# Patient Record
Sex: Male | Born: 1975 | Race: Black or African American | Hispanic: No | Smoking: Current some day smoker
Health system: Southern US, Community
[De-identification: ages and names within clinical notes are randomized; demographics above are authoritative.]

## PROBLEM LIST (undated history)

## (undated) DIAGNOSIS — J45909 Unspecified asthma, uncomplicated: Secondary | ICD-10-CM

---

## 2015-11-30 ENCOUNTER — Emergency Department (HOSPITAL_COMMUNITY)
Admission: EM | Admit: 2015-11-30 | Discharge: 2015-11-30 | Disposition: A | Discharge status: Home / Self Care | Payer: Self-pay | Attending: Emergency Medicine | Admitting: Emergency Medicine

## 2015-11-30 ENCOUNTER — Encounter (HOSPITAL_COMMUNITY): Payer: Self-pay

## 2015-11-30 DIAGNOSIS — J45901 Unspecified asthma with (acute) exacerbation: Secondary | ICD-10-CM | POA: Insufficient documentation

## 2015-11-30 DIAGNOSIS — Z79899 Other long term (current) drug therapy: Secondary | ICD-10-CM | POA: Insufficient documentation

## 2015-11-30 DIAGNOSIS — F17200 Nicotine dependence, unspecified, uncomplicated: Secondary | ICD-10-CM | POA: Insufficient documentation

## 2015-11-30 HISTORY — DX: Unspecified asthma, uncomplicated: J45.909

## 2015-11-30 MED ORDER — ALBUTEROL SULFATE HFA 108 (90 BASE) MCG/ACT IN AERS: 2.0000 | INHALATION_SPRAY | RESPIRATORY_TRACT | Status: DC | PRN

## 2015-11-30 MED ORDER — ALBUTEROL SULFATE (2.5 MG/3ML) 0.083% IN NEBU: 2.5000 mg | INHALATION_SOLUTION | RESPIRATORY_TRACT | Status: DC | PRN

## 2015-11-30 MED ORDER — ALBUTEROL SULFATE (2.5 MG/3ML) 0.083% IN NEBU
5.0000 mg | INHALATION_SOLUTION | Freq: Once | RESPIRATORY_TRACT | Status: AC
  Administered 2015-11-30: 5 mg via RESPIRATORY_TRACT
  Filled 2015-11-30: qty 6

## 2015-11-30 MED ORDER — ALBUTEROL SULFATE (2.5 MG/3ML) 0.083% IN NEBU
5.0000 mg | INHALATION_SOLUTION | Freq: Once | RESPIRATORY_TRACT | Status: AC
  Administered 2015-11-30: 5 mg via RESPIRATORY_TRACT

## 2015-11-30 MED ORDER — ALBUTEROL SULFATE HFA 108 (90 BASE) MCG/ACT IN AERS
2.0000 | INHALATION_SPRAY | RESPIRATORY_TRACT | Status: DC | PRN
  Administered 2015-11-30: 2 via RESPIRATORY_TRACT
  Filled 2015-11-30: qty 6.7

## 2015-11-30 MED ORDER — AEROCHAMBER PLUS W/MASK MISC
1.0000 | Freq: Once | Status: AC
  Administered 2015-11-30: 1
  Filled 2015-11-30: qty 1

## 2015-11-30 MED ORDER — PREDNISONE 20 MG PO TABS: 40.0000 mg | ORAL_TABLET | Freq: Every day | ORAL | Status: DC

## 2015-11-30 MED ORDER — PREDNISONE 20 MG PO TABS
60.0000 mg | ORAL_TABLET | Freq: Once | ORAL | Status: AC
  Administered 2015-11-30: 60 mg via ORAL
  Filled 2015-11-30: qty 3

## 2015-11-30 MED ORDER — IPRATROPIUM BROMIDE 0.02 % IN SOLN
0.5000 mg | Freq: Once | RESPIRATORY_TRACT | Status: AC
  Administered 2015-11-30: 0.5 mg via RESPIRATORY_TRACT
  Filled 2015-11-30: qty 2.5

## 2015-11-30 MED ORDER — ALBUTEROL SULFATE (2.5 MG/3ML) 0.083% IN NEBU
INHALATION_SOLUTION | RESPIRATORY_TRACT | Status: AC
  Administered 2015-11-30: 5 mg via RESPIRATORY_TRACT
  Filled 2015-11-30: qty 6

## 2015-11-30 NOTE — Discharge Instructions (Signed)
1. Medications: albuterol, prednisone, usual home medications 2. Treatment: rest, drink plenty of fluids, begin OTC antihistamine (Zyrtec or Claritin)  3. Follow Up: Please followup with your primary doctor in 2-3 days for discussion of your diagnoses and further evaluation after today's visit; if you do not have a primary care doctor use the resource guide provided to find one; Please return to the ER for difficulty breathing, high fevers or worsening symptoms.    Asthma, Acute Bronchospasm Acute bronchospasm caused by asthma is also referred to as an asthma attack. Bronchospasm means your air passages become narrowed. The narrowing is caused by inflammation and tightening of the muscles in the air tubes (bronchi) in your lungs. This can make it hard to breathe or cause you to wheeze and cough. CAUSES Possible triggers are:  Animal dander from the skin, hair, or feathers of animals.  Dust mites contained in house dust.  Cockroaches.  Pollen from trees or grass.  Mold.  Cigarette or tobacco smoke.  Air pollutants such as dust, household cleaners, hair sprays, aerosol sprays, paint fumes, strong chemicals, or strong odors.  Cold air or weather changes. Cold air may trigger inflammation. Winds increase molds and pollens in the air.  Strong emotions such as crying or laughing hard.  Stress.  Certain medicines such as aspirin or beta-blockers.  Sulfites in foods and drinks, such as dried fruits and wine.  Infections or inflammatory conditions, such as a flu, cold, or inflammation of the nasal membranes (rhinitis).  Gastroesophageal reflux disease (GERD). GERD is a condition where stomach acid backs up into your esophagus.  Exercise or strenuous activity. SIGNS AND SYMPTOMS   Wheezing.  Excessive coughing, particularly at night.  Chest tightness.  Shortness of breath. DIAGNOSIS  Your health care provider will ask you about your medical history and perform a physical exam.  A chest X-ray or blood testing may be performed to look for other causes of your symptoms or other conditions that may have triggered your asthma attack. TREATMENT  Treatment is aimed at reducing inflammation and opening up the airways in your lungs. Most asthma attacks are treated with inhaled medicines. These include quick relief or rescue medicines (such as bronchodilators) and controller medicines (such as inhaled corticosteroids). These medicines are sometimes given through an inhaler or a nebulizer. Systemic steroid medicine taken by mouth or given through an IV tube also can be used to reduce the inflammation when an attack is moderate or severe. Antibiotic medicines are only used if a bacterial infection is present.  HOME CARE INSTRUCTIONS   Rest.  Drink plenty of liquids. This helps the mucus to remain thin and be easily coughed up. Only use caffeine in moderation and do not use alcohol until you have recovered from your illness.  Do not smoke. Avoid being exposed to secondhand smoke.  You play a critical role in keeping yourself in good health. Avoid exposure to things that cause you to wheeze or to have breathing problems.  Keep your medicines up-to-date and available. Carefully follow your health care provider's treatment plan.  Take your medicine exactly as prescribed.  When pollen or pollution is bad, keep windows closed and use an air conditioner or go to places with air conditioning.  Asthma requires careful medical care. See your health care provider for a follow-up as advised. If you are more than [redacted] weeks pregnant and you were prescribed any new medicines, let your obstetrician know about the visit and how you are doing. Follow up with  your health care provider as directed.  After you have recovered from your asthma attack, make an appointment with your outpatient doctor to talk about ways to reduce the likelihood of future attacks. If you do not have a doctor who manages  your asthma, make an appointment with a primary care doctor to discuss your asthma. SEEK IMMEDIATE MEDICAL CARE IF:   You are getting worse.  You have trouble breathing. If severe, call your local emergency services (911 in the [place]  You develop chest pain or discomfort.  You are vomiting.  You are not able to keep fluids down.  You are coughing up yellow, green, brown, or bloody sputum.  You have a fever and your symptoms suddenly get worse.  You have trouble swallowing. MAKE SURE YOU:   Understand these instructions.  Will watch your condition.  Will get help right away if you are not doing well or get worse.   This information is not intended to replace advice given to you by your health care provider. Make sure you discuss any questions you have with your health care provider.   Document Released: 09/22/2006 Document Revised: 06/12/2013 Document Reviewed: 12/13/2012 Elsevier Interactive Patient Education [employer]2016 [employer] Inc[employer]

## 2015-11-30 NOTE — ED Notes (Signed)
Pt talking on cell phone throughout the duration of his nebulizer treatment.

## 2015-11-30 NOTE — ED Provider Notes (Signed)
CSN: [id number]     Arrival date & time 11/30/15  0258 History   First MD Initiated Contact with Patient 11/30/15 [phone]     Chief Complaint  Patient presents with  . Asthma     (Consider location/radiation/quality/duration/timing/severity/associated sxs/prior Treatment) The history is provided by the patient and medical records. No language interpreter was used.     Jeffrey Briggs is a 40 y.o. male  with a hx of asthma presents to the Emergency Department complaining of gradual, persistent, progressively worsening cough, shortness of breath, wheezing and chest tightness onset 3 days ago.  Patient reports that at that time he was still using his albuterol MDI and nebulizer at home with some relief. He reports that approximately 48 hours ago he ran out of these medications. Since that time he said slowly worsening shortness of breath and wheezing.  Tonight he reports that his wheezing was so bad he was unable to sleep.  Patient denies fever, chills, nausea, vomiting.  No aggravating or alleviating factors.  He reports previous admissions as a child for his asthma but no known intubations.   Past Medical History  Diagnosis Date  . Asthma    History reviewed. No pertinent past surgical history. No family history on file. Social History  Substance Use Topics  . Smoking status: Current Some Day Smoker  . Smokeless tobacco: None  . Alcohol Use: Yes    Review of Systems  Constitutional: Negative for fever, diaphoresis, appetite change, fatigue and unexpected weight change.  HENT: Negative for mouth sores.   Eyes: Negative for visual disturbance.  Respiratory: Positive for cough, chest tightness, shortness of breath and wheezing.   Cardiovascular: Negative for chest pain.  Gastrointestinal: Negative for nausea, vomiting, abdominal pain, diarrhea and constipation.  Endocrine: Negative for polydipsia, polyphagia and polyuria.  Genitourinary: Negative for dysuria, urgency, frequency and  hematuria.  Musculoskeletal: Negative for back pain and neck stiffness.  Skin: Negative for rash.  Allergic/Immunologic: Negative for immunocompromised state.  Neurological: Negative for syncope, light-headedness and headaches.  Hematological: Does not bruise/bleed easily.  Psychiatric/Behavioral: Negative for sleep disturbance. The patient is not nervous/anxious.       Allergies  Review of patient's allergies indicates no known allergies.  Home Medications   Prior to Admission medications   Medication Sig Start Date End Date Taking? Authorizing Provider  albuterol (PROVENTIL HFA;VENTOLIN HFA) 108 (90 Base) MCG/ACT inhaler Inhale 2 puffs into the lungs every 4 (four) hours as needed for wheezing or shortness of breath. 11/30/15   Lameka Disla, PA-C  albuterol (PROVENTIL) (2.5 MG/3ML) 0.083% nebulizer solution Take 3 mLs (2.5 mg total) by nebulization every 4 (four) hours as needed for wheezing or shortness of breath. 11/30/15   Marynell Bies, PA-C  predniSONE (DELTASONE) 20 MG tablet Take 2 tablets (40 mg total) by mouth daily. 11/30/15   Mane Consolo, PA-C   BP 123/68 mmHg  Pulse 68  Temp(Src) 98 F (36.7 C) (Oral)  Resp 20  Ht  (1.702 m)  Wt 104.327 kg  BMI 36.01 kg/m2  SpO2 100% Physical Exam  Constitutional: He appears well-developed and well-nourished. No distress.  Awake, alert, nontoxic appearance  HENT:  Head: Normocephalic and atraumatic.  Mouth/Throat: Oropharynx is clear and moist. No oropharyngeal exudate.  Eyes: Conjunctivae are normal. No scleral icterus.  Neck: Normal range of motion. Neck supple.  Cardiovascular: Normal rate, regular rhythm, normal heart sounds and intact distal pulses.   Pulmonary/Chest: Effort normal. No respiratory distress. He has decreased breath sounds.  He has wheezes ( Throughout).  Equal chest expansion  Abdominal: Soft. Bowel sounds are normal. He exhibits no mass. There is no tenderness. There is no rebound and  no guarding.  Musculoskeletal: Normal range of motion. He exhibits no edema.  Neurological: He is alert.  Speech is clear and goal oriented Moves extremities without ataxia  Skin: Skin is warm and dry. He is not diaphoretic.  Psychiatric: He has a normal mood and affect.  Nursing note and vitals reviewed.   ED Course  Procedures (including critical care time)   MDM   Final diagnoses:  Asthma exacerbation   Jeffrey Briggs presents with a history of asthma. Evidence of asthma exacerbation today. Patient is out of his home meds. Patient given albuterol treatment 2 here in the emergency department along with by mouth prednisone.   6:35 AM Patient ambulated in ED with O2 saturations maintained >90, no current signs of respiratory distress. Lung exam improved after nebulizer treatment. Prednisone given in the ED and pt will bd dc with 5 day burst. Pt states they are breathing at baseline. Pt has been instructed to continue using prescribed medications and to speak with PCP about today's exacerbation.     Dahlia ClientHannah Ashlin Kreps, PA-C 11/30/15 [phone]  Dione Boozeavid Glick, MD 11/30/15 [phone]

## 2015-11-30 NOTE — ED Notes (Signed)
Pt reports asthma exacerbation that started about a week ago and feels SOB. He ran out of his albuterol and does not have his rescue inhaler. Pt speaking in clear complete sentences, RR unlabored.

## 2016-04-16 ENCOUNTER — Encounter (HOSPITAL_COMMUNITY): Payer: Self-pay | Admitting: Emergency Medicine

## 2016-04-16 ENCOUNTER — Emergency Department (HOSPITAL_COMMUNITY)
Admission: EM | Admit: 2016-04-16 | Discharge: 2016-04-16 | Disposition: A | Discharge status: Home / Self Care | Payer: Medicaid - Out of State | Attending: Emergency Medicine | Admitting: Emergency Medicine

## 2016-04-16 DIAGNOSIS — J45901 Unspecified asthma with (acute) exacerbation: Secondary | ICD-10-CM | POA: Insufficient documentation

## 2016-04-16 DIAGNOSIS — Z79899 Other long term (current) drug therapy: Secondary | ICD-10-CM | POA: Insufficient documentation

## 2016-04-16 MED ORDER — ALBUTEROL SULFATE HFA 108 (90 BASE) MCG/ACT IN AERS: 2.0000 | INHALATION_SPRAY | RESPIRATORY_TRACT | 0 refills | Status: DC | PRN

## 2016-04-16 MED ORDER — IPRATROPIUM-ALBUTEROL 0.5-2.5 (3) MG/3ML IN SOLN
3.0000 mL | Freq: Once | RESPIRATORY_TRACT | Status: AC
  Administered 2016-04-16: 3 mL via RESPIRATORY_TRACT
  Filled 2016-04-16: qty 3

## 2016-04-16 MED ORDER — PREDNISONE 20 MG PO TABS: 40.0000 mg | ORAL_TABLET | Freq: Every day | ORAL | 0 refills | Status: DC

## 2016-04-16 MED ORDER — ALBUTEROL SULFATE (2.5 MG/3ML) 0.083% IN NEBU: 2.5000 mg | INHALATION_SOLUTION | Freq: Four times a day (QID) | RESPIRATORY_TRACT | 12 refills | Status: DC | PRN

## 2016-04-16 MED ORDER — ALBUTEROL SULFATE HFA 108 (90 BASE) MCG/ACT IN AERS
2.0000 | INHALATION_SPRAY | Freq: Once | RESPIRATORY_TRACT | Status: AC
  Administered 2016-04-16: 2 via RESPIRATORY_TRACT
  Filled 2016-04-16: qty 6.7

## 2016-04-16 MED ORDER — PREDNISONE 20 MG PO TABS
60.0000 mg | ORAL_TABLET | Freq: Once | ORAL | Status: AC
  Administered 2016-04-16: 60 mg via ORAL
  Filled 2016-04-16: qty 3

## 2016-04-16 NOTE — Discharge Instructions (Signed)
Inhaler or neb treatment every 4 hrs. Prednisone as prescribed until all gone. Follow up with family doctor. Return if worsening.

## 2016-04-16 NOTE — ED Provider Notes (Signed)
[facility]-EMERGENCY DEPT Provider Note   CSN: [id number] Arrival date & time: 04/16/16  0559     History   Chief Complaint Chief Complaint  Patient presents with  . Asthma    HPI Jeffrey Briggs is a 40 y.o. male.  HPI Jeffrey Briggs is a 40 y.o. male with hx of asthma, presents to ED with complaint of worsening wheezing and shortness of breath over the last week. Patient states he has asthma. He has been using nebulizer machine at home with relief. He states he used his last treatment last night. He states he hasn't been able to sleep last night due to wheezing and shortness of breath. He states he started a new job one week ago he works around [employer]. He believes that there something in the mulch that is flaring of his asthma. He denies any cough. He denies any URI symptoms. No fever or chills. No chest pain. States he also ran out of the inhaler about 2 months ago. He hasn't been able to follow-up to get his refills. He also states he is supposed to be taking Claritin but has not been taking it over the last several weeks.  Past Medical History:  Diagnosis Date  . Asthma     There are no active problems to display for this patient.   History reviewed. No pertinent surgical history.     Home Medications    Prior to Admission medications   Medication Sig Start Date End Date Taking? Authorizing Provider  albuterol (PROVENTIL HFA;VENTOLIN HFA) 108 (90 Base) MCG/ACT inhaler Inhale 2 puffs into the lungs every 4 (four) hours as needed for wheezing or shortness of breath. 11/30/15   Hannah Muthersbaugh, PA-C  albuterol (PROVENTIL) (2.5 MG/3ML) 0.083% nebulizer solution Take 3 mLs (2.5 mg total) by nebulization every 4 (four) hours as needed for wheezing or shortness of breath. 11/30/15   Hannah Muthersbaugh, PA-C  predniSONE (DELTASONE) 20 MG tablet Take 2 tablets (40 mg total) by mouth daily. 11/30/15   Dahlia Client Muthersbaugh, PA-C    Family History Family History  Problem  Relation Age of Onset  . Diabetes Other     Social History Social History  Substance Use Topics  . Smoking status: Never Smoker  . Smokeless tobacco: Never Used  . Alcohol use No     Allergies   Review of patient's allergies indicates no known allergies.   Review of Systems Review of Systems  Constitutional: Negative for chills and fever.  Respiratory: Positive for chest tightness, shortness of breath and wheezing. Negative for cough.   Cardiovascular: Negative for chest pain, palpitations and leg swelling.  Gastrointestinal: Negative for abdominal distention, abdominal pain, diarrhea, nausea and vomiting.  Genitourinary: Negative for hematuria.  Musculoskeletal: Negative for arthralgias, myalgias, neck pain and neck stiffness.  Skin: Negative for rash.  Allergic/Immunologic: Negative for immunocompromised state.  Neurological: Negative for dizziness, weakness, light-headedness, numbness and headaches.     Physical Exam Updated Vital Signs BP 144/98 (BP Location: Left Arm)   Pulse 71   Temp 97.8 F (36.6 C) (Oral)   Resp 22   Ht 5\' 7"  (1.702 m)   Wt 109.8 kg   SpO2 97%   BMI 37.92 kg/m   Physical Exam  Constitutional: He appears well-developed and well-nourished. No distress.  HENT:  Head: Normocephalic and atraumatic.  Eyes: Conjunctivae are normal.  Neck: Neck supple.  Cardiovascular: Normal rate, regular rhythm and normal heart sounds.   Pulmonary/Chest: Effort normal. No respiratory distress. He has  wheezes. He has no rales.  Decreased air movement bilaterally, expiratory wheezes present in both lung fields. No respiratory distress, no accessory muscle use. Speaking full sentences  Abdominal: Soft. Bowel sounds are normal. He exhibits no distension. There is no tenderness. There is no rebound.  Musculoskeletal: He exhibits no edema.  Neurological: He is alert.  Skin: Skin is warm and dry.  Nursing note and vitals reviewed.    ED Treatments / Results    Labs (all labs ordered are listed, but only abnormal results are displayed) Labs Reviewed - No data to display  EKG  EKG Interpretation None       Radiology No results found.  Procedures Procedures (including critical care time)  Medications Ordered in ED Medications  predniSONE (DELTASONE) tablet 60 mg (not administered)  ipratropium-albuterol (DUONEB) 0.5-2.5 (3) MG/3ML nebulizer solution 3 mL (not administered)     Initial Impression / Assessment and Plan / ED Course  I have reviewed the triage vital signs and the nursing notes.  Pertinent labs & imaging results that were available during my care of the patient were reviewed by me and considered in my medical decision making (see chart for details).  Clinical Course  Patient with asthma exacerbation, ran out of his medications. On exam, decreased air movement bilaterally with expiratory wheezes. Nebulized treatment ordered and 60 of prednisone. Will reassess. Vital signs are normal.   She received a breathing treatment. Vital signs are normal. We'll discharge home with an inhaler, nebulized treatments, short course of prednisone. Follow with primary care doctor.  Vitals:   04/16/16 0607 04/16/16 0700 04/16/16 0728 04/16/16 0738  BP:  (!) 150/105  132/95  Pulse:  74  63  Resp:  23  18  Temp:      TempSrc:      SpO2:  94% 98% 100%  Weight: 109.8 kg     Height: 5\' 7"  (1.702 m)         Final Clinical Impressions(s) / ED Diagnoses   Final diagnoses:  Exacerbation of asthma, unspecified asthma severity, unspecified whether persistent    New Prescriptions Discharge Medication List as of 04/16/2016  7:38 AM    START taking these medications   Details  !! albuterol (PROVENTIL HFA;VENTOLIN HFA) 108 (90 Base) MCG/ACT inhaler Inhale 2 puffs into the lungs every 4 (four) hours as needed for wheezing or shortness of breath., Starting Fri 04/16/2016, Print    !! albuterol (PROVENTIL) (2.5 MG/3ML) 0.083% nebulizer  solution Take 3 mLs (2.5 mg total) by nebulization every 6 (six) hours as needed for wheezing or shortness of breath., Starting Fri 04/16/2016, Print    !! predniSONE (DELTASONE) 20 MG tablet Take 2 tablets (40 mg total) by mouth daily., Starting Fri 04/16/2016, Print     !! - Potential duplicate medications found. Please discuss with provider.       Jaynie Crumbleatyana Jadene Stemmer, PA-C 04/16/16 1622    Maia PlanJoshua G Long, MD 04/16/16 [phone]

## 2016-04-16 NOTE — ED Triage Notes (Signed)
Pt states he has asthma and has been short of breath for the past 2 days  Pt states he ran out of his albuterol neb medication and does not have an inhaler

## 2016-05-25 ENCOUNTER — Emergency Department (HOSPITAL_COMMUNITY): Payer: No Typology Code available for payment source

## 2016-05-25 ENCOUNTER — Encounter (HOSPITAL_COMMUNITY): Payer: Self-pay | Admitting: Emergency Medicine

## 2016-05-25 ENCOUNTER — Emergency Department (HOSPITAL_COMMUNITY)
Admission: EM | Admit: 2016-05-25 | Discharge: 2016-05-25 | Disposition: A | Discharge status: Home / Self Care | Payer: No Typology Code available for payment source | Attending: Emergency Medicine | Admitting: Emergency Medicine

## 2016-05-25 DIAGNOSIS — Y92410 Unspecified street and highway as the place of occurrence of the external cause: Secondary | ICD-10-CM | POA: Insufficient documentation

## 2016-05-25 DIAGNOSIS — J45909 Unspecified asthma, uncomplicated: Secondary | ICD-10-CM | POA: Diagnosis not present

## 2016-05-25 DIAGNOSIS — S161XXA Strain of muscle, fascia and tendon at neck level, initial encounter: Secondary | ICD-10-CM | POA: Diagnosis not present

## 2016-05-25 DIAGNOSIS — S0990XA Unspecified injury of head, initial encounter: Secondary | ICD-10-CM | POA: Insufficient documentation

## 2016-05-25 DIAGNOSIS — Y939 Activity, unspecified: Secondary | ICD-10-CM | POA: Diagnosis not present

## 2016-05-25 DIAGNOSIS — Y999 Unspecified external cause status: Secondary | ICD-10-CM | POA: Insufficient documentation

## 2016-05-25 DIAGNOSIS — S199XXA Unspecified injury of neck, initial encounter: Secondary | ICD-10-CM | POA: Diagnosis present

## 2016-05-25 DIAGNOSIS — S0003XA Contusion of scalp, initial encounter: Secondary | ICD-10-CM | POA: Diagnosis not present

## 2016-05-25 MED ORDER — ALBUTEROL SULFATE HFA 108 (90 BASE) MCG/ACT IN AERS
2.0000 | INHALATION_SPRAY | RESPIRATORY_TRACT | Status: DC | PRN
  Administered 2016-05-25: 2 via RESPIRATORY_TRACT
  Filled 2016-05-25: qty 6.7

## 2016-05-25 MED ORDER — IBUPROFEN 800 MG PO TABS
800.0000 mg | ORAL_TABLET | Freq: Once | ORAL | Status: AC
  Administered 2016-05-25: 800 mg via ORAL
  Filled 2016-05-25: qty 1

## 2016-05-25 MED ORDER — CYCLOBENZAPRINE HCL 10 MG PO TABS: 10.0000 mg | ORAL_TABLET | Freq: Two times a day (BID) | ORAL | 0 refills | Status: DC | PRN

## 2016-05-25 MED ORDER — IBUPROFEN 800 MG PO TABS: 800.0000 mg | ORAL_TABLET | Freq: Three times a day (TID) | ORAL | 0 refills | Status: DC
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
## 2016-05-25 NOTE — ED Notes (Signed)
Patient transported to X-ray & CT °

## 2016-05-25 NOTE — ED Notes (Signed)
Pt now requesting inhaler stating "I'm out.  I don't have one and my asthma is acting up."
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
## 2016-05-31 ENCOUNTER — Encounter (HOSPITAL_COMMUNITY): Payer: Self-pay | Admitting: Emergency Medicine

## 2016-05-31 ENCOUNTER — Ambulatory Visit (HOSPITAL_COMMUNITY)
Admission: EM | Admit: 2016-05-31 | Discharge: 2016-05-31 | Disposition: A | Discharge status: Home / Self Care | Payer: Medicaid - Out of State | Attending: Family Medicine | Admitting: Family Medicine

## 2016-05-31 DIAGNOSIS — M25511 Pain in right shoulder: Secondary | ICD-10-CM

## 2016-05-31 DIAGNOSIS — M5440 Lumbago with sciatica, unspecified side: Secondary | ICD-10-CM

## 2016-05-31 DIAGNOSIS — M25521 Pain in right elbow: Secondary | ICD-10-CM

## 2016-05-31 DIAGNOSIS — M25512 Pain in left shoulder: Secondary | ICD-10-CM

## 2016-05-31 MED ORDER — PREDNISONE 10 MG (21) PO TBPK: 10.0000 mg | ORAL_TABLET | Freq: Every day | ORAL | 0 refills | Status: DC

## 2016-05-31 MED ORDER — CYCLOBENZAPRINE HCL 10 MG PO TABS: 10.0000 mg | ORAL_TABLET | Freq: Two times a day (BID) | ORAL | 0 refills | Status: DC | PRN

## 2016-05-31 MED ORDER — KETOROLAC TROMETHAMINE 60 MG/2ML IM SOLN
INTRAMUSCULAR | Status: AC
  Filled 2016-05-31: qty 2

## 2016-05-31 MED ORDER — KETOROLAC TROMETHAMINE 60 MG/2ML IM SOLN
60.0000 mg | Freq: Once | INTRAMUSCULAR | Status: AC
  Administered 2016-05-31: 60 mg via INTRAMUSCULAR
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
## 2016-06-02 ENCOUNTER — Ambulatory Visit: Payer: No Typology Code available for payment source | Attending: Internal Medicine | Admitting: Physician Assistant

## 2016-06-02 DIAGNOSIS — M25511 Pain in right shoulder: Secondary | ICD-10-CM | POA: Insufficient documentation

## 2016-06-02 DIAGNOSIS — M25512 Pain in left shoulder: Secondary | ICD-10-CM | POA: Insufficient documentation

## 2016-06-02 DIAGNOSIS — M542 Cervicalgia: Secondary | ICD-10-CM | POA: Insufficient documentation

## 2016-06-02 DIAGNOSIS — J4520 Mild intermittent asthma, uncomplicated: Secondary | ICD-10-CM

## 2016-06-02 DIAGNOSIS — M549 Dorsalgia, unspecified: Secondary | ICD-10-CM | POA: Insufficient documentation

## 2016-06-02 DIAGNOSIS — R51 Headache: Secondary | ICD-10-CM | POA: Insufficient documentation

## 2016-06-02 DIAGNOSIS — J45909 Unspecified asthma, uncomplicated: Secondary | ICD-10-CM | POA: Diagnosis not present

## 2016-06-02 DIAGNOSIS — Z79899 Other long term (current) drug therapy: Secondary | ICD-10-CM | POA: Diagnosis not present

## 2016-06-02 DIAGNOSIS — M25521 Pain in right elbow: Secondary | ICD-10-CM | POA: Diagnosis not present

## 2016-06-02 MED ORDER — ALBUTEROL SULFATE (2.5 MG/3ML) 0.083% IN NEBU: 2.5000 mg | INHALATION_SOLUTION | RESPIRATORY_TRACT | 1 refills | Status: DC | PRN

## 2016-06-02 MED ORDER — PREDNISONE 10 MG (21) PO TBPK: 10.0000 mg | ORAL_TABLET | Freq: Every day | ORAL | 0 refills | Status: DC

## 2016-06-02 MED ORDER — ALBUTEROL SULFATE HFA 108 (90 BASE) MCG/ACT IN AERS: 2.0000 | INHALATION_SPRAY | RESPIRATORY_TRACT | 3 refills | Status: DC | PRN

## 2016-06-02 MED ORDER — CYCLOBENZAPRINE HCL 10 MG PO TABS: 10.0000 mg | ORAL_TABLET | Freq: Two times a day (BID) | ORAL | 0 refills | Status: DC | PRN

## 2016-06-02 MED FILL — ALBUTEROL 0.083% INHAL SOLN: (2.5 MG/3ML | 30 days supply | Qty: 90 | Fill #0

## 2016-06-02 MED FILL — !VENTOLIN HFA INHALER: 108 (90 BAS | 28 days supply | Qty: 18 | Fill #0

## 2016-06-02 MED FILL — CYCLOBENZAPRINE 10 MG TAB: 10 | 10 days supply | Qty: 20 | Fill #0

## 2016-06-02 MED FILL — predniSONE 10 MG TABS: 10 | 14 days supply | Qty: 42 | Fill #0
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
## 2016-06-02 NOTE — Progress Notes (Signed)
Pt is having pain in right arm, lower back, shoulders and 2 fingers on his right hand.

## 2016-06-07 ENCOUNTER — Encounter (HOSPITAL_COMMUNITY): Payer: Self-pay

## 2016-06-07 ENCOUNTER — Emergency Department (HOSPITAL_COMMUNITY): Payer: No Typology Code available for payment source

## 2016-06-07 ENCOUNTER — Ambulatory Visit (HOSPITAL_COMMUNITY)
Admission: EM | Admit: 2016-06-07 | Discharge: 2016-06-07 | Disposition: A | Discharge status: Nursing Home (Includes ICF) | Payer: Self-pay | Attending: Family Medicine | Admitting: Family Medicine

## 2016-06-07 ENCOUNTER — Emergency Department (HOSPITAL_COMMUNITY)
Admission: EM | Admit: 2016-06-07 | Discharge: 2016-06-07 | Disposition: A | Discharge status: Home / Self Care | Payer: No Typology Code available for payment source | Attending: Emergency Medicine | Admitting: Emergency Medicine

## 2016-06-07 DIAGNOSIS — S3992XD Unspecified injury of lower back, subsequent encounter: Secondary | ICD-10-CM | POA: Diagnosis not present

## 2016-06-07 DIAGNOSIS — J45909 Unspecified asthma, uncomplicated: Secondary | ICD-10-CM | POA: Diagnosis not present

## 2016-06-07 DIAGNOSIS — R202 Paresthesia of skin: Secondary | ICD-10-CM

## 2016-06-07 DIAGNOSIS — M549 Dorsalgia, unspecified: Secondary | ICD-10-CM

## 2016-06-07 DIAGNOSIS — Z79899 Other long term (current) drug therapy: Secondary | ICD-10-CM | POA: Diagnosis not present

## 2016-06-07 DIAGNOSIS — R350 Frequency of micturition: Secondary | ICD-10-CM

## 2016-06-07 DIAGNOSIS — R200 Anesthesia of skin: Secondary | ICD-10-CM

## 2016-06-07 LAB — URINALYSIS, ROUTINE W REFLEX MICROSCOPIC
Bilirubin Urine: NEGATIVE
GLUCOSE, UA: NEGATIVE mg/dL
Hgb urine dipstick: NEGATIVE
Ketones, ur: NEGATIVE mg/dL
LEUKOCYTES UA: NEGATIVE
Nitrite: NEGATIVE
PROTEIN: NEGATIVE mg/dL
Specific Gravity, Urine: 1.024 (ref 1.005–1.030)
pH: 6 (ref 5.0–8.0)

## 2016-06-07 LAB — CBG MONITORING, ED: GLUCOSE-CAPILLARY: 91 mg/dL (ref 65–99)

## 2016-06-07 NOTE — ED Triage Notes (Signed)
[facility] complaints of numbness in left foot and upper limbs x2 weeks accompanied with headaches x3 days.  [facility]. He received Xrays and a CT scan.
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
## 2016-06-07 NOTE — ED Notes (Signed)
Pt reports "feeling fine" and wishes to go home.

## 2016-06-07 NOTE — Discharge Instructions (Signed)

## 2016-06-07 NOTE — ED Notes (Signed)
Pt at nurses station requesting to leave. EDP aware. Will work on paperwork
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
## 2016-06-07 NOTE — ED Notes (Signed)
Pt given sandwich meal and water.

## 2016-06-07 NOTE — ED Notes (Signed)
Patient transported to MRI 

## 2016-06-07 NOTE — ED Notes (Signed)
Unable to obtained pt signature for discharge d/t malfunction of signature pad in room.   Reviewed d/c instructions with pt, who had no questions or further complaints at this time. Pt departed in NAD, refused use of wheelchair.
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
## 2016-06-22 ENCOUNTER — Ambulatory Visit: Payer: Self-pay | Admitting: Family Medicine

## 2016-08-31 ENCOUNTER — Emergency Department (HOSPITAL_COMMUNITY)
Admission: EM | Admit: 2016-08-31 | Discharge: 2016-08-31 | Disposition: A | Discharge status: Home / Self Care | Payer: Self-pay | Attending: Emergency Medicine | Admitting: Emergency Medicine

## 2016-08-31 ENCOUNTER — Encounter (HOSPITAL_COMMUNITY): Payer: Self-pay | Admitting: Emergency Medicine

## 2016-08-31 DIAGNOSIS — R69 Illness, unspecified: Secondary | ICD-10-CM

## 2016-08-31 DIAGNOSIS — Z79899 Other long term (current) drug therapy: Secondary | ICD-10-CM | POA: Insufficient documentation

## 2016-08-31 DIAGNOSIS — J45909 Unspecified asthma, uncomplicated: Secondary | ICD-10-CM | POA: Insufficient documentation

## 2016-08-31 DIAGNOSIS — J111 Influenza due to unidentified influenza virus with other respiratory manifestations: Secondary | ICD-10-CM | POA: Insufficient documentation

## 2016-08-31 DIAGNOSIS — J029 Acute pharyngitis, unspecified: Secondary | ICD-10-CM

## 2016-08-31 LAB — RAPID STREP SCREEN (MED CTR MEBANE ONLY): Streptococcus, Group A Screen (Direct): NEGATIVE

## 2016-08-31 MED ORDER — FLUTICASONE PROPIONATE 50 MCG/ACT NA SUSP: 2.0000 | Freq: Every day | NASAL | 0 refills | Status: DC

## 2016-08-31 MED ORDER — OSELTAMIVIR PHOSPHATE 75 MG PO CAPS: 75.0000 mg | ORAL_CAPSULE | Freq: Two times a day (BID) | ORAL | 0 refills | Status: DC

## 2016-08-31 MED ORDER — NAPROXEN 500 MG PO TABS: 500.0000 mg | ORAL_TABLET | Freq: Two times a day (BID) | ORAL | 0 refills | Status: DC

## 2016-08-31 MED ORDER — ACETAMINOPHEN 325 MG PO TABS
650.0000 mg | ORAL_TABLET | Freq: Once | ORAL | Status: AC | PRN
  Administered 2016-08-31: 650 mg via ORAL
  Filled 2016-08-31: qty 2

## 2016-08-31 MED ORDER — CETIRIZINE HCL 10 MG PO TABS: 10.0000 mg | ORAL_TABLET | Freq: Every day | ORAL | 1 refills | Status: DC

## 2016-08-31 NOTE — ED Provider Notes (Signed)
[facility]-EMERGENCY DEPT Provider Note   CSN: [id number] Arrival date & time: 08/31/16  1951     History   Chief Complaint Chief Complaint  Patient presents with  . Generalized Body Aches    HPI Jeffrey Briggs is a 41 y.o. male.  Jeffrey Briggs is a 41 y.o. Male who presents to the emergency department complaining of sore throat, fevers and body aches since yesterday. He reports associated fatigue, postnasal drip and nasal congestion. Reports trying Theraflu at home earlier this morning with little relief. He is not sure if he received his flu shot this year. He reports pain with swallowing but no difficulty swallowing. He denies coughing, trouble swallowing, shortness of breath, nausea, vomiting, diarrhea, rashes, neck stiffness, or urinary symptoms.   The history is provided by the patient and medical records. No language interpreter was used.    Past Medical History:  Diagnosis Date  . Asthma     There are no active problems to display for this patient.   History reviewed. No pertinent surgical history.     Home Medications    Prior to Admission medications   Medication Sig Start Date End Date Taking? Authorizing Provider  albuterol (PROVENTIL HFA;VENTOLIN HFA) 108 (90 Base) MCG/ACT inhaler Inhale 2 puffs into the lungs every 4 (four) hours as needed for wheezing or shortness of breath. Patient not taking: Reported on 06/07/2016 04/16/16   Tatyana Kirichenko, PA-C  albuterol (PROVENTIL HFA;VENTOLIN HFA) 108 (90 Base) MCG/ACT inhaler Inhale 2 puffs into the lungs every 4 (four) hours as needed for wheezing or shortness of breath. 06/02/16   Vivianne Master, PA-C  albuterol (PROVENTIL) (2.5 MG/3ML) 0.083% nebulizer solution Take 3 mLs (2.5 mg total) by nebulization every 6 (six) hours as needed for wheezing or shortness of breath. Patient not taking: Reported on 06/07/2016 04/16/16   Tatyana Kirichenko, PA-C  albuterol (PROVENTIL) (2.5 MG/3ML) 0.083% nebulizer solution  Take 3 mLs (2.5 mg total) by nebulization every 4 (four) hours as needed for wheezing or shortness of breath. 06/02/16   Tiffany Netta Cedars, PA-C  cetirizine (ZYRTEC ALLERGY) 10 MG tablet Take 1 tablet (10 mg total) by mouth daily. 08/31/16   Everlene Farrier, PA-C  cyclobenzaprine (FLEXERIL) 10 MG tablet Take 1 tablet (10 mg total) by mouth 2 (two) times daily as needed for muscle spasms. 06/02/16   Tiffany Netta Cedars, PA-C  fluticasone (FLONASE) 50 MCG/ACT nasal spray Place 2 sprays into both nostrils daily. 08/31/16   Everlene Farrier, PA-C  ibuprofen (ADVIL,MOTRIN) 800 MG tablet Take 1 tablet (800 mg total) by mouth 3 (three) times daily. 05/25/16   Fayrene Helper, PA-C  naproxen (NAPROSYN) 500 MG tablet Take 1 tablet (500 mg total) by mouth 2 (two) times daily with a meal. 08/31/16   Everlene Farrier, PA-C  oseltamivir (TAMIFLU) 75 MG capsule Take 1 capsule (75 mg total) by mouth every 12 (twelve) hours. 08/31/16   Everlene Farrier, PA-C  predniSONE (DELTASONE) 20 MG tablet Take 2 tablets (40 mg total) by mouth daily. Patient not taking: Reported on 06/07/2016 11/30/15   Dahlia Client Muthersbaugh, PA-C  predniSONE (DELTASONE) 20 MG tablet Take 2 tablets (40 mg total) by mouth daily. Patient not taking: Reported on 06/07/2016 04/16/16   Tatyana Kirichenko, PA-C  predniSONE (STERAPRED UNI-PAK 21 TAB) 10 MG (21) TBPK tablet Take 1 tablet (10 mg total) by mouth daily. Take 6 tabs by mouth daily  for 2 days, then 5 tabs for 2 days, then 4 tabs for 2 days, then  3 tabs for 2 days, 2 tabs for 2 days, then 1 tab by mouth daily for 2 days Patient not taking: Reported on 06/07/2016 06/02/16   Vivianne Master, PA-C    Family History Family History  Problem Relation Age of Onset  . Diabetes Other     Social History Social History  Substance Use Topics  . Smoking status: Never Smoker  . Smokeless tobacco: Never Used  . Alcohol use No     Allergies   Patient has no known allergies.   Review of Systems Review of Systems    Constitutional: Negative for chills and fever.  HENT: Positive for congestion, postnasal drip, rhinorrhea, sneezing and sore throat. Negative for mouth sores, trouble swallowing and voice change.   Respiratory: Negative for cough and shortness of breath.   Cardiovascular: Negative for chest pain.  Gastrointestinal: Negative for diarrhea, nausea and vomiting.  Genitourinary: Negative for dysuria.  Musculoskeletal: Negative for back pain and neck pain.  Skin: Negative for rash.  Neurological: Negative for dizziness, light-headedness and headaches.     Physical Exam Updated Vital Signs BP 124/78 (BP Location: Left Arm)   Pulse 98   Temp 100.7 F (38.2 C) (Oral)   Resp 20   SpO2 98%   Physical Exam  Constitutional: He appears well-developed and well-nourished. No distress.  Nontoxic appearing.  HENT:  Head: Normocephalic and atraumatic.  Right Ear: External ear normal.  Left Ear: External ear normal.  Moderate bilateral tonsillar hypertrophy with exudates. Uvula is midline without edema. No peritonsillar abscess. No trismus. No drooling. Tongue protrusion is normal. Rhinorrhea and boggy nasal turbinates bilaterally.  Eyes: Conjunctivae are normal. Pupils are equal, round, and reactive to light. Right eye exhibits no discharge. Left eye exhibits no discharge.  Neck: Normal range of motion. Neck supple. No JVD present. No tracheal deviation present.  Cardiovascular: Normal rate, regular rhythm, normal heart sounds and intact distal pulses.   Pulmonary/Chest: Effort normal and breath sounds normal. No stridor. No respiratory distress. He has no wheezes. He has no rales.  Lungs are clear to auscultation bilaterally. No increased work of breathing. No rales or rhonchi.  Abdominal: Soft. There is no tenderness.  Lymphadenopathy:    He has no cervical adenopathy.  Neurological: He is alert. Coordination normal.  Skin: Skin is warm and dry. Capillary refill takes less than 2 seconds. No  rash noted. He is not diaphoretic. No erythema. No pallor.  Psychiatric: He has a normal mood and affect. His behavior is normal.  Nursing note and vitals reviewed.    ED Treatments / Results  Labs (all labs ordered are listed, but only abnormal results are displayed) Labs Reviewed  RAPID STREP SCREEN (NOT AT [facility])  CULTURE, GROUP A STREP [facility])    EKG  EKG Interpretation None       Radiology No results found.  Procedures Procedures (including critical care time)  Medications Ordered in ED Medications  acetaminophen (TYLENOL) tablet 650 mg (650 mg Oral Given 08/31/16 2034)     Initial Impression / Assessment and Plan / ED Course  I have reviewed the triage vital signs and the nursing notes.  Pertinent labs & imaging results that were available during my care of the patient were reviewed by me and considered in my medical decision making (see chart for details).    This is a 41 y.o. Male who presents to the emergency department complaining of sore throat, fevers and body aches since yesterday. He reports associated fatigue, postnasal  drip and nasal congestion. On arrival to the emergency department the patient has a temperature 100.7. On exam patient is nontoxic appearing. His rhinorrhea and boggy nasal terminates bilaterally. He has mild bilateral tonsillar hypertrophy with exudates. No peritonsillar abscess. No drooling. Lungs are clear auscultation bilaterally. He denies any coughing or shortness of breath. I see no need for chest x-ray at this time, due to his lungs being clear and patient has not complained of any coughing or shortness of breath. Rapid strep was negative. Patient Casimiro NeedleMichael with influenza-like illness. Will start on Tamiflu, Zyrtec, Flonase and naproxen. I discussed the expected course and treatment of flu. I discussed the expected side effects of Tamiflu. I advised the patient to follow-up with their primary care provider this week. I advised the patient to  return to the emergency department with new or worsening symptoms or new concerns. The patient verbalized understanding and agreement with plan.     Final Clinical Impressions(s) / ED Diagnoses   Final diagnoses:  Influenza-like illness  Sore throat    New Prescriptions New Prescriptions   CETIRIZINE (ZYRTEC ALLERGY) 10 MG TABLET    Take 1 tablet (10 mg total) by mouth daily.   FLUTICASONE (FLONASE) 50 MCG/ACT NASAL SPRAY    Place 2 sprays into both nostrils daily.   NAPROXEN (NAPROSYN) 500 MG TABLET    Take 1 tablet (500 mg total) by mouth 2 (two) times daily with a meal.   OSELTAMIVIR (TAMIFLU) 75 MG CAPSULE    Take 1 capsule (75 mg total) by mouth every 12 (twelve) hours.     Everlene FarrierWilliam Joda Braatz, PA-C 08/31/16 [id number]    Shaune Pollackameron Isaacs, MD 09/01/16 [phone]

## 2016-08-31 NOTE — ED Triage Notes (Signed)
Pt states that he has had generalized body aches, sore throat and fatigue since yesterday. Tried home remedies w/o success. Alert and oriented.

## 2016-09-01 MED FILL — ?CETIRIZINE HCL 10 MG TABLE: 10 | 30 days supply | Qty: 30 | Fill #0

## 2016-09-01 MED FILL — ?NAPROXEN 500 MG TAB: 500 MG | 15 days supply | Qty: 30 | Fill #0

## 2016-09-02 LAB — CULTURE, GROUP A STREP (THRC)

## 2017-04-19 ENCOUNTER — Ambulatory Visit (INDEPENDENT_AMBULATORY_CARE_PROVIDER_SITE_OTHER): Payer: Self-pay | Admitting: Orthopaedic Surgery

## 2017-05-24 ENCOUNTER — Ambulatory Visit (INDEPENDENT_AMBULATORY_CARE_PROVIDER_SITE_OTHER): Payer: Self-pay | Admitting: Orthopaedic Surgery

## 2017-05-25 ENCOUNTER — Ambulatory Visit (INDEPENDENT_AMBULATORY_CARE_PROVIDER_SITE_OTHER): Payer: Self-pay | Admitting: Orthopaedic Surgery

## 2017-05-27 ENCOUNTER — Ambulatory Visit (INDEPENDENT_AMBULATORY_CARE_PROVIDER_SITE_OTHER): Payer: PRIVATE HEALTH INSURANCE | Admitting: Orthopaedic Surgery

## 2017-06-02 ENCOUNTER — Ambulatory Visit (INDEPENDENT_AMBULATORY_CARE_PROVIDER_SITE_OTHER): Payer: PRIVATE HEALTH INSURANCE | Admitting: Orthopaedic Surgery

## 2017-06-24 ENCOUNTER — Telehealth (INDEPENDENT_AMBULATORY_CARE_PROVIDER_SITE_OTHER): Payer: Self-pay | Admitting: Orthopaedic Surgery

## 2017-06-24 NOTE — Telephone Encounter (Signed)
IC Cynthia @ [facility] and advised patient was no show for 12/13 appt as she had called requesting notes from the visit

## 2017-06-29 ENCOUNTER — Encounter (HOSPITAL_COMMUNITY): Payer: Self-pay

## 2017-06-29 ENCOUNTER — Emergency Department (HOSPITAL_COMMUNITY): Payer: Worker's Compensation

## 2017-06-29 ENCOUNTER — Emergency Department (HOSPITAL_COMMUNITY)
Admission: EM | Admit: 2017-06-29 | Discharge: 2017-06-29 | Disposition: A | Discharge status: Home / Self Care | Payer: Worker's Compensation | Attending: Emergency Medicine | Admitting: Emergency Medicine

## 2017-06-29 ENCOUNTER — Other Ambulatory Visit: Payer: Self-pay

## 2017-06-29 DIAGNOSIS — M545 Low back pain: Secondary | ICD-10-CM | POA: Diagnosis not present

## 2017-06-29 DIAGNOSIS — R0781 Pleurodynia: Secondary | ICD-10-CM | POA: Diagnosis not present

## 2017-06-29 DIAGNOSIS — J45909 Unspecified asthma, uncomplicated: Secondary | ICD-10-CM | POA: Diagnosis not present

## 2017-06-29 DIAGNOSIS — R200 Anesthesia of skin: Secondary | ICD-10-CM | POA: Insufficient documentation

## 2017-06-29 DIAGNOSIS — M79672 Pain in left foot: Secondary | ICD-10-CM | POA: Diagnosis not present

## 2017-06-29 DIAGNOSIS — Z041 Encounter for examination and observation following transport accident: Secondary | ICD-10-CM | POA: Insufficient documentation

## 2017-06-29 DIAGNOSIS — Z79899 Other long term (current) drug therapy: Secondary | ICD-10-CM | POA: Diagnosis not present

## 2017-06-29 MED ORDER — METHOCARBAMOL 500 MG PO TABS: 500.0000 mg | ORAL_TABLET | Freq: Every evening | ORAL | 0 refills | Status: DC | PRN

## 2017-06-29 MED ORDER — IBUPROFEN 800 MG PO TABS
800.0000 mg | ORAL_TABLET | Freq: Once | ORAL | Status: AC
  Administered 2017-06-29: 800 mg via ORAL
  Filled 2017-06-29: qty 1

## 2017-06-29 MED ORDER — NAPROXEN 500 MG PO TABS: 500.0000 mg | ORAL_TABLET | Freq: Two times a day (BID) | ORAL | 0 refills | Status: DC
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
## 2017-06-29 NOTE — ED Notes (Signed)
Bed: WTR8 Expected date:  Expected time:  Means of arrival:  Comments: 
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
## 2017-06-29 NOTE — ED Notes (Signed)
Pt is alert and oriented x 4 and is verbally responsive. [t reports that he has been having left leg pain and lower back

## 2017-07-06 ENCOUNTER — Encounter: Payer: Self-pay | Admitting: Critical Care Medicine

## 2017-07-06 ENCOUNTER — Ambulatory Visit: Payer: Worker's Compensation | Attending: Critical Care Medicine | Admitting: Critical Care Medicine

## 2017-07-06 VITALS — BP 137/81 | HR 86 | Temp 98.1°F | Resp 16 | Wt 249.2 lb

## 2017-07-06 DIAGNOSIS — M79672 Pain in left foot: Secondary | ICD-10-CM | POA: Insufficient documentation

## 2017-07-06 DIAGNOSIS — M25511 Pain in right shoulder: Secondary | ICD-10-CM | POA: Insufficient documentation

## 2017-07-06 DIAGNOSIS — Z79899 Other long term (current) drug therapy: Secondary | ICD-10-CM | POA: Diagnosis not present

## 2017-07-06 DIAGNOSIS — J4541 Moderate persistent asthma with (acute) exacerbation: Secondary | ICD-10-CM | POA: Insufficient documentation

## 2017-07-06 DIAGNOSIS — J45909 Unspecified asthma, uncomplicated: Secondary | ICD-10-CM | POA: Diagnosis present

## 2017-07-06 DIAGNOSIS — M545 Low back pain, unspecified: Secondary | ICD-10-CM | POA: Insufficient documentation

## 2017-07-06 DIAGNOSIS — R0781 Pleurodynia: Secondary | ICD-10-CM | POA: Insufficient documentation

## 2017-07-06 DIAGNOSIS — J209 Acute bronchitis, unspecified: Secondary | ICD-10-CM | POA: Diagnosis not present

## 2017-07-06 MED ORDER — PREDNISONE 10 MG PO TABS: ORAL_TABLET | ORAL | 0 refills | Status: AC

## 2017-07-06 MED ORDER — FLUTICASONE PROPIONATE 50 MCG/ACT NA SUSP: 2.0000 | Freq: Every day | NASAL | 4 refills | Status: AC

## 2017-07-06 MED ORDER — AZITHROMYCIN 250 MG PO TABS
250.0000 mg | ORAL_TABLET | Freq: Every day | ORAL | 0 refills | Status: DC
Start: 2017-07-06 — End: 2017-10-19

## 2017-07-06 MED ORDER — ALBUTEROL SULFATE (2.5 MG/3ML) 0.083% IN NEBU: 2.5000 mg | INHALATION_SOLUTION | Freq: Four times a day (QID) | RESPIRATORY_TRACT | 12 refills | Status: DC | PRN

## 2017-07-06 MED ORDER — ACETAMINOPHEN-CODEINE #3 300-30 MG PO TABS: 1.0000 | ORAL_TABLET | ORAL | 0 refills | Status: DC | PRN

## 2017-07-06 MED ORDER — ALBUTEROL SULFATE HFA 108 (90 BASE) MCG/ACT IN AERS: 2.0000 | INHALATION_SPRAY | RESPIRATORY_TRACT | 3 refills | Status: DC | PRN

## 2017-07-06 MED ORDER — METHOCARBAMOL 500 MG PO TABS: 500.0000 mg | ORAL_TABLET | Freq: Every evening | ORAL | 1 refills | Status: DC | PRN

## 2017-07-06 MED FILL — ACETAMINOPHEN/COD #3 TABLET: 300-30 | 5 days supply | Qty: 30 | Fill #0

## 2017-07-06 MED FILL — ALBUTEROL 0.083% INHAL SOLN: (2.5 MG/3ML | 7 days supply | Qty: 90 | Fill #0
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
## 2017-07-06 NOTE — Assessment & Plan Note (Signed)
r shoulder and Left lower back pain Neg for rib fx Neg cxr Non focal findings Plan Tylenol #3 prn  Robaxin prn Return for recheck and establish with pcp

## 2017-07-06 NOTE — Assessment & Plan Note (Signed)
Moderate persistent asthma with flare and acute bronchitis Plan  Pulse prednisone saba prn Azithromycin 250mg  Take two once then one daily until gone

## 2017-07-06 NOTE — Patient Instructions (Signed)
Start tylenol with codeine #3  One every 4-6hours as needed Resume robaxin as needed for muscle spasm STart prednisone 10mg  Take 4 for three days 3 for three days 2 for three days 1 for three days and stop Start azithromycin 250mg  Take two once then one daily until gone Use albuterol in nebulizer or by handheld inhaler as needed Return 08/17/17 for lung follow up We will get you established for primary care here

## 2017-07-07 ENCOUNTER — Ambulatory Visit (HOSPITAL_BASED_OUTPATIENT_CLINIC_OR_DEPARTMENT_OTHER): Payer: Worker's Compensation | Admitting: Family Medicine

## 2017-07-07 ENCOUNTER — Encounter (HOSPITAL_COMMUNITY): Payer: Self-pay | Admitting: Emergency Medicine

## 2017-07-07 ENCOUNTER — Emergency Department (HOSPITAL_COMMUNITY)
Admission: EM | Admit: 2017-07-07 | Discharge: 2017-07-07 | Disposition: A | Discharge status: Home / Self Care | Payer: Worker's Compensation | Attending: Physician Assistant | Admitting: Physician Assistant

## 2017-07-07 ENCOUNTER — Emergency Department (HOSPITAL_COMMUNITY): Payer: Worker's Compensation

## 2017-07-07 ENCOUNTER — Encounter: Payer: Self-pay | Admitting: Family Medicine

## 2017-07-07 VITALS — BP 106/72 | HR 78 | Temp 98.1°F | Resp 16 | Ht 69.0 in | Wt 248.6 lb

## 2017-07-07 DIAGNOSIS — Z79899 Other long term (current) drug therapy: Secondary | ICD-10-CM | POA: Insufficient documentation

## 2017-07-07 DIAGNOSIS — M5442 Lumbago with sciatica, left side: Secondary | ICD-10-CM

## 2017-07-07 DIAGNOSIS — F17210 Nicotine dependence, cigarettes, uncomplicated: Secondary | ICD-10-CM | POA: Insufficient documentation

## 2017-07-07 DIAGNOSIS — M5441 Lumbago with sciatica, right side: Secondary | ICD-10-CM | POA: Insufficient documentation

## 2017-07-07 DIAGNOSIS — M545 Low back pain: Secondary | ICD-10-CM | POA: Diagnosis present

## 2017-07-07 DIAGNOSIS — M791 Myalgia, unspecified site: Secondary | ICD-10-CM | POA: Insufficient documentation

## 2017-07-07 DIAGNOSIS — M5432 Sciatica, left side: Secondary | ICD-10-CM

## 2017-07-07 DIAGNOSIS — R0602 Shortness of breath: Secondary | ICD-10-CM | POA: Insufficient documentation

## 2017-07-07 DIAGNOSIS — G834 Cauda equina syndrome: Secondary | ICD-10-CM | POA: Insufficient documentation

## 2017-07-07 DIAGNOSIS — R269 Unspecified abnormalities of gait and mobility: Secondary | ICD-10-CM | POA: Insufficient documentation

## 2017-07-07 DIAGNOSIS — M79672 Pain in left foot: Secondary | ICD-10-CM

## 2017-07-07 LAB — COMPREHENSIVE METABOLIC PANEL
ALK PHOS: 70 U/L (ref 38–126)
ALT: 43 U/L (ref 17–63)
AST: 25 U/L (ref 15–41)
Albumin: 3.5 g/dL (ref 3.5–5.0)
Anion gap: 7 (ref 5–15)
BILIRUBIN TOTAL: 0.5 mg/dL (ref 0.3–1.2)
BUN: 12 mg/dL (ref 6–20)
CALCIUM: 9 mg/dL (ref 8.9–10.3)
CO2: 24 mmol/L (ref 22–32)
CREATININE: 1.1 mg/dL (ref 0.61–1.24)
Chloride: 107 mmol/L (ref 101–111)
GFR calc non Af Amer: >60 mL/min (ref >60)
Glucose, Bld: 117 mg/dL — ABNORMAL HIGH (ref 65–99)
Potassium: 3.8 mmol/L (ref 3.5–5.1)
Sodium: 138 mmol/L (ref 135–145)
TOTAL PROTEIN: 7.4 g/dL (ref 6.5–8.1)

## 2017-07-07 LAB — CBC WITH DIFFERENTIAL/PLATELET
Basophils Absolute: 0.1 10*3/uL (ref 0.0–0.1)
Basophils Relative: 0 %
Eosinophils Absolute: 0.6 10*3/uL (ref 0.0–0.7)
Eosinophils Relative: 4 %
HEMATOCRIT: 42.5 % (ref 39.0–52.0)
HEMOGLOBIN: 14.1 g/dL (ref 13.0–17.0)
LYMPHS ABS: 4.9 10*3/uL — AB (ref 0.7–4.0)
LYMPHS PCT: 31 %
MCH: 31.1 pg (ref 26.0–34.0)
MCHC: 33.2 g/dL (ref 30.0–36.0)
MCV: 93.6 fL (ref 78.0–100.0)
MONOS PCT: 8 %
Monocytes Absolute: 1.3 10*3/uL — ABNORMAL HIGH (ref 0.1–1.0)
NEUTROS ABS: 9.2 10*3/uL — AB (ref 1.7–7.7)
NEUTROS PCT: 57 %
Platelets: 317 10*3/uL (ref 150–400)
RBC: 4.54 MIL/uL (ref 4.22–5.81)
RDW: 14.5 % (ref 11.5–15.5)
WBC: 16 10*3/uL — AB (ref 4.0–10.5)

## 2017-07-07 LAB — URINALYSIS, ROUTINE W REFLEX MICROSCOPIC
BILIRUBIN URINE: NEGATIVE
GLUCOSE, UA: NEGATIVE mg/dL
Hgb urine dipstick: NEGATIVE
Ketones, ur: 5 mg/dL — AB
Leukocytes, UA: NEGATIVE
NITRITE: NEGATIVE
PROTEIN: NEGATIVE mg/dL
Specific Gravity, Urine: 1.028 (ref 1.005–1.030)
pH: 6 (ref 5.0–8.0)

## 2017-07-07 MED ORDER — OXYCODONE-ACETAMINOPHEN 5-325 MG PO TABS
1.0000 | ORAL_TABLET | Freq: Once | ORAL | Status: DC
  Filled 2017-07-07: qty 1

## 2017-07-07 MED ORDER — ALBUTEROL SULFATE HFA 108 (90 BASE) MCG/ACT IN AERS
2.0000 | INHALATION_SPRAY | Freq: Once | RESPIRATORY_TRACT | Status: AC
  Administered 2017-07-07: 2 via RESPIRATORY_TRACT
  Filled 2017-07-07: qty 6.7

## 2017-07-07 MED ORDER — OXYCODONE-ACETAMINOPHEN 5-325 MG PO TABS: 1.0000 | ORAL_TABLET | Freq: Four times a day (QID) | ORAL | 0 refills | Status: DC | PRN

## 2017-07-07 MED ORDER — CYCLOBENZAPRINE HCL 10 MG PO TABS: 10.0000 mg | ORAL_TABLET | Freq: Two times a day (BID) | ORAL | 0 refills | Status: DC | PRN

## 2017-07-07 MED ORDER — NAPROXEN 500 MG PO TABS: 500.0000 mg | ORAL_TABLET | Freq: Two times a day (BID) | ORAL | 0 refills | Status: DC

## 2017-07-07 MED ORDER — IBUPROFEN 800 MG PO TABS
800.0000 mg | ORAL_TABLET | Freq: Once | ORAL | Status: AC
  Administered 2017-07-07: 800 mg via ORAL
  Filled 2017-07-07: qty 1

## 2017-07-07 NOTE — ED Notes (Signed)
[place] [employer] at [place].

## 2017-07-07 NOTE — Patient Instructions (Addendum)
Go to the Emergency Department. Back Pain, Adult Back pain is very common. The pain often gets better over time. The cause of back pain is usually not dangerous. Most people can learn to manage their back pain on their own. Follow these instructions at home: Watch your back pain for any changes. The following actions may help to lessen any pain you are feeling:  Stay active. Start with short walks on flat ground if you can. Try to walk farther each day.  Exercise regularly as told by your doctor. Exercise helps your back heal faster. It also helps avoid future injury by keeping your muscles strong and flexible.  Do not sit, drive, or stand in one place for more than 30 minutes.  Do not stay in bed. Resting more than 1-2 days can slow down your recovery.  Be careful when you bend or lift an object. Use good form when lifting: ? Bend at your knees. ? Keep the object close to your body. ? Do not twist.  Sleep on a firm mattress. Lie on your side, and bend your knees. If you lie on your back, put a pillow under your knees.  Take medicines only as told by your doctor.  Put ice on the injured area. ? Put ice in a plastic bag. ? Place a towel between your skin and the bag. ? Leave the ice on for 20 minutes, 2-3 times a day for the first 2-3 days. After that, you can switch between ice and heat packs.  Avoid feeling anxious or stressed. Find good ways to deal with stress, such as exercise.  Maintain a healthy weight. Extra weight puts stress on your back.  Contact a doctor if:  You have pain that does not go away with rest or medicine.  You have worsening pain that goes down into your legs or buttocks.  You have pain that does not get better in one week.  You have pain at night.  You lose weight.  You have a fever or chills. Get help right away if:  You cannot control when you poop (bowel movement) or pee (urinate).  Your arms or legs feel weak.  Your arms or legs lose  feeling (numbness).  You feel sick to your stomach (nauseous) or throw up (vomit).  You have belly (abdominal) pain.  You feel like you may pass out (faint). This information is not intended to replace advice given to you by your health care provider. Make sure you discuss any questions you have with your health care provider. Document Released: 11/24/2007 Document Revised: 11/13/2015 Document Reviewed: 10/09/2013 Elsevier Interactive Patient Education  [employer]2018 [employer]

## 2017-07-07 NOTE — ED Notes (Signed)
Patient transported to MRI 

## 2017-07-07 NOTE — ED Triage Notes (Addendum)
[facility]/o left lower back pain radiating to left foot since that time. Evaluated on 1/9. Reports intermittent tingling in left leg and one episode of urinary incontinence this morning. [facility] Ambulatory.

## 2017-07-07 NOTE — Discharge Instructions (Signed)
You had a reassuring MRI.  [facility] for additional pain medication. We will give you some to get through until you are able to see them.
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
## 2017-07-07 NOTE — ED Notes (Signed)
Patient given sandwich and water

## 2017-07-07 NOTE — ED Notes (Signed)
Pt yelling and cursing in hallway, pt is upset that he has been waiting. Explained to we are moving pts as soon as possible.
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
## 2017-07-07 NOTE — Progress Notes (Signed)
Patient is here for back pain and left foot pain. Patient stated it hurts more with movement.

## 2017-07-15 ENCOUNTER — Encounter: Payer: Self-pay | Admitting: Family Medicine

## 2017-07-15 ENCOUNTER — Ambulatory Visit: Payer: Worker's Compensation | Attending: Family Medicine | Admitting: Family Medicine

## 2017-07-15 ENCOUNTER — Ambulatory Visit: Payer: Worker's Compensation | Admitting: Licensed Clinical Social Worker

## 2017-07-15 VITALS — BP 136/83 | HR 74 | Temp 97.5°F | Ht 69.0 in | Wt 249.6 lb

## 2017-07-15 DIAGNOSIS — Z09 Encounter for follow-up examination after completed treatment for conditions other than malignant neoplasm: Secondary | ICD-10-CM

## 2017-07-15 DIAGNOSIS — F4323 Adjustment disorder with mixed anxiety and depressed mood: Secondary | ICD-10-CM | POA: Insufficient documentation

## 2017-07-15 DIAGNOSIS — M5442 Lumbago with sciatica, left side: Secondary | ICD-10-CM | POA: Diagnosis not present

## 2017-07-15 DIAGNOSIS — M545 Low back pain, unspecified: Secondary | ICD-10-CM

## 2017-07-15 DIAGNOSIS — M5432 Sciatica, left side: Secondary | ICD-10-CM

## 2017-07-15 DIAGNOSIS — M79672 Pain in left foot: Secondary | ICD-10-CM | POA: Diagnosis not present

## 2017-07-15 DIAGNOSIS — M549 Dorsalgia, unspecified: Secondary | ICD-10-CM | POA: Diagnosis present

## 2017-07-15 DIAGNOSIS — Z79899 Other long term (current) drug therapy: Secondary | ICD-10-CM | POA: Diagnosis not present

## 2017-07-15 DIAGNOSIS — M62831 Muscle spasm of calf: Secondary | ICD-10-CM | POA: Insufficient documentation
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
## 2017-07-15 NOTE — Patient Instructions (Addendum)
Follow up with PT and previous ortho referrals. Apply for orange card and discount.   Back Pain, Adult Many adults have back pain from time to time. Common causes of back pain include:  A strained muscle or ligament.  Wear and tear (degeneration) of the spinal disks.  Arthritis.  A hit to the back.  Back pain can be short-lived (acute) or last a long time (chronic). A physical exam, lab tests, and imaging studies may be done to find the cause of your pain. Follow these instructions at home: Managing pain and stiffness  Take over-the-counter and prescription medicines only as told by your health care provider.  If directed, apply heat to the affected area as often as told by your health care provider. Use the heat source that your health care provider recommends, such as a moist heat pack or a heating pad. ? Place a towel between your skin and the heat source. ? Leave the heat on for 20-30 minutes. ? Remove the heat if your skin turns bright red. This is especially important if you are unable to feel pain, heat, or cold. You have a greater risk of getting burned.  If directed, apply ice to the injured area: ? Put ice in a plastic bag. ? Place a towel between your skin and the bag. ? Leave the ice on for 20 minutes, 2-3 times a day for the first 2-3 days. Activity  Do not stay in bed. Resting more than 1-2 days can delay your recovery.  Take short walks on even surfaces as soon as you are able. Try to increase the length of time you walk each day.  Do not sit, drive, or stand in one place for more than 30 minutes at a time. Sitting or standing for long periods of time can put stress on your back.  Use proper lifting techniques. When you bend and lift, use positions that put less stress on your back: ? [place] your knees. ? Keep the load close to your body. ? Avoid twisting.  Exercise regularly as told by your health care provider. Exercising will help your back heal faster. This  also helps prevent back injuries by keeping muscles strong and flexible.  Your health care provider may recommend that you see a physical therapist. This person can help you come up with a safe exercise program. Do any exercises as told by your physical therapist. Lifestyle  Maintain a healthy weight. Extra weight puts stress on your back and makes it difficult to have good posture.  Avoid activities or situations that make you feel anxious or stressed. Learn ways to manage anxiety and stress. One way to manage stress is through exercise. Stress and anxiety increase muscle tension and can make back pain worse. General instructions  Sleep on a firm mattress in a comfortable position. Try lying on your side with your knees slightly bent. If you lie on your back, put a pillow under your knees.  Follow your treatment plan as told by your health care provider. This may include: ? Cognitive or behavioral therapy. ? Acupuncture or massage therapy. ? Meditation or yoga. Contact a health care provider if:  You have pain that is not relieved with rest or medicine.  You have increasing pain going down into your legs or buttocks.  Your pain does not improve in 2 weeks.  You have pain at night.  You lose weight.  You have a fever or chills. Get help right away if:  You develop  new bowel or bladder control problems.  You have unusual weakness or numbness in your arms or legs.  You develop nausea or vomiting.  You develop abdominal pain.  You feel faint. Summary  Many adults have back pain from time to time. A physical exam, lab tests, and imaging studies may be done to find the cause of your pain.  Use proper lifting techniques. When you bend and lift, use positions that put less stress on your back.  Take over-the-counter and prescription medicines and apply heat or ice as directed by your health care provider. This information is not intended to replace advice given to you by your  health care provider. Make sure you discuss any questions you have with your health care provider. Document Released: 06/07/2005 Document Revised: 07/12/2016 Document Reviewed: 07/12/2016 Elsevier Interactive Patient Education  [employer].
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
## 2017-08-17 ENCOUNTER — Ambulatory Visit: Payer: Self-pay | Admitting: Critical Care Medicine

## 2017-10-12 ENCOUNTER — Emergency Department (HOSPITAL_COMMUNITY)
Admission: EM | Admit: 2017-10-12 | Discharge: 2017-10-12 | Disposition: A | Discharge status: Home / Self Care | Payer: Worker's Compensation | Attending: Emergency Medicine | Admitting: Emergency Medicine

## 2017-10-12 ENCOUNTER — Emergency Department (HOSPITAL_COMMUNITY): Payer: Worker's Compensation

## 2017-10-12 ENCOUNTER — Other Ambulatory Visit: Payer: Self-pay

## 2017-10-12 ENCOUNTER — Encounter (HOSPITAL_COMMUNITY): Payer: Self-pay | Admitting: Emergency Medicine

## 2017-10-12 DIAGNOSIS — M25511 Pain in right shoulder: Secondary | ICD-10-CM | POA: Insufficient documentation

## 2017-10-12 DIAGNOSIS — Z043 Encounter for examination and observation following other accident: Secondary | ICD-10-CM | POA: Diagnosis present

## 2017-10-12 DIAGNOSIS — F17210 Nicotine dependence, cigarettes, uncomplicated: Secondary | ICD-10-CM | POA: Diagnosis not present

## 2017-10-12 DIAGNOSIS — Y929 Unspecified place or not applicable: Secondary | ICD-10-CM | POA: Diagnosis not present

## 2017-10-12 DIAGNOSIS — M545 Low back pain, unspecified: Secondary | ICD-10-CM

## 2017-10-12 DIAGNOSIS — Y939 Activity, unspecified: Secondary | ICD-10-CM | POA: Diagnosis not present

## 2017-10-12 DIAGNOSIS — W19XXXA Unspecified fall, initial encounter: Secondary | ICD-10-CM

## 2017-10-12 DIAGNOSIS — W1830XA Fall on same level, unspecified, initial encounter: Secondary | ICD-10-CM | POA: Insufficient documentation

## 2017-10-12 MED ORDER — METHOCARBAMOL 500 MG PO TABS: 500.0000 mg | ORAL_TABLET | Freq: Every evening | ORAL | 0 refills | Status: DC | PRN

## 2017-10-12 MED ORDER — IBUPROFEN 600 MG PO TABS: 600.0000 mg | ORAL_TABLET | Freq: Four times a day (QID) | ORAL | 0 refills | Status: DC | PRN

## 2017-10-12 MED ORDER — ACETAMINOPHEN 500 MG PO TABS
1000.0000 mg | ORAL_TABLET | Freq: Once | ORAL | Status: AC
  Administered 2017-10-12: 1000 mg via ORAL
  Filled 2017-10-12: qty 2
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
## 2017-10-19 ENCOUNTER — Ambulatory Visit: Payer: Self-pay | Attending: Family Medicine | Admitting: Physician Assistant

## 2017-10-19 VITALS — BP 136/84 | HR 70 | Temp 97.4°F | Resp 16 | Wt 242.4 lb

## 2017-10-19 DIAGNOSIS — M5441 Lumbago with sciatica, right side: Secondary | ICD-10-CM | POA: Insufficient documentation

## 2017-10-19 DIAGNOSIS — M25511 Pain in right shoulder: Secondary | ICD-10-CM

## 2017-10-19 DIAGNOSIS — M5442 Lumbago with sciatica, left side: Secondary | ICD-10-CM

## 2017-10-19 DIAGNOSIS — Z888 Allergy status to other drugs, medicaments and biological substances status: Secondary | ICD-10-CM | POA: Insufficient documentation

## 2017-10-19 DIAGNOSIS — Z791 Long term (current) use of non-steroidal anti-inflammatories (NSAID): Secondary | ICD-10-CM | POA: Insufficient documentation

## 2017-10-19 DIAGNOSIS — J45909 Unspecified asthma, uncomplicated: Secondary | ICD-10-CM | POA: Insufficient documentation

## 2017-10-19 DIAGNOSIS — Z7952 Long term (current) use of systemic steroids: Secondary | ICD-10-CM | POA: Insufficient documentation

## 2017-10-19 DIAGNOSIS — Z09 Encounter for follow-up examination after completed treatment for conditions other than malignant neoplasm: Secondary | ICD-10-CM

## 2017-10-19 DIAGNOSIS — Z79899 Other long term (current) drug therapy: Secondary | ICD-10-CM | POA: Insufficient documentation

## 2017-10-19 MED ORDER — METHOCARBAMOL 500 MG PO TABS: 1000.0000 mg | ORAL_TABLET | Freq: Three times a day (TID) | ORAL | 0 refills | Status: AC

## 2017-10-19 MED ORDER — NAPROXEN 500 MG PO TABS: 500.0000 mg | ORAL_TABLET | Freq: Two times a day (BID) | ORAL | 0 refills | Status: AC

## 2017-10-19 MED FILL — METHOCARBAMOL 500 MG TABLET: 500 | 15 days supply | Qty: 90 | Fill #0

## 2017-10-19 MED FILL — NAPROXEN 500 MG TABLET: 500 | 30 days supply | Qty: 60 | Fill #0

## 2017-10-19 NOTE — Patient Instructions (Signed)
Low Back Sprain  A sprain is a stretch or tear in the bands of tissue that hold bones and joints together (ligaments). Sprains of the lower back (lumbar spine) are a common cause of low back pain. A sprain occurs when ligaments are overextended or stretched beyond their limits. The ligaments can become inflamed, resulting in pain and sudden muscle tightening (spasms). A sprain can be caused by an injury (trauma), or it can develop gradually due to overuse.  There are three types of sprains:  · Grade 1 is a mild sprain involving an overstretched ligament or a very slight tear of the ligament.  · Grade 2 is a moderate sprain involving a partial tear of the ligament.  · Grade 3 is a severe sprain involving a complete tear of the ligament.    What are the causes?  This condition may be caused by:  · Trauma, such as a fall or a hit to the body.  · Twisting or overstretching the back. This may result from doing activities that require a lot of energy, such as lifting heavy objects.    What increases the risk?  The following factors may increase your risk of getting this condition:  · Playing contact sports.  · Participating in sports or activities that put excessive stress on the back and require a lot of bending and twisting, including:  ? Lifting weights or heavy objects.  ? Gymnastics.  ? Soccer.  ? Figure skating.  ? Snowboarding.  · Being overweight or obese.  · Having poor strength and flexibility.    What are the signs or symptoms?  Symptoms of this condition may include:  · Sharp or dull pain in the lower back that does not go away. Pain may extend to the buttocks.  · Stiffness.  · Limited range of motion.  · Inability to stand up straight due to stiffness or pain.  · Muscle spasms.    How is this diagnosed?    This condition may be diagnosed based on:  · Your symptoms.  · Your medical history.  · A physical exam.  ? Your health care provider may push on certain areas of your back to determine the source of your  pain.  ? You may be asked to bend forward, backward, and side to side to assess the severity of your pain and your range of motion.  · Imaging tests, such as:  ? X-rays.  ? MRI.    How is this treated?  Treatment for this condition may include:  · Applying heat and cold to the affected area.  · Medicines to help relieve pain and to relax your muscles (muscle relaxants).  · NSAIDs to help reduce swelling and discomfort.  · Physical therapy.    When your symptoms improve, it is important to gradually return to your normal routine as soon as possible to reduce pain, avoid stiffness, and avoid loss of muscle strength. Generally, symptoms should improve within 6 weeks of treatment. However, recovery time varies.  Follow these instructions at home:  Managing pain, stiffness, and swelling  · If directed, apply ice to the injured area during the first 24 hours after your injury.  ? Put ice in a plastic bag.  ? Place a towel between your skin and the bag.  ? Leave the ice on for 20 minutes, 2-3 times a day.  · If directed, apply heat to the affected area as often as told by your health care provider. Use the   heat source that your health care provider recommends, such as a moist heat pack or a heating pad.  ? Place a towel between your skin and the heat source.  ? Leave the heat on for 20-30 minutes.  ? Remove the heat if your skin turns bright red. This is especially important if you are unable to feel pain, heat, or cold. You may have a greater risk of getting burned.  Activity  · Rest and return to your normal activities as told by your health care provider. Ask your health care provider what activities are safe for you.  · Avoid activities that take a lot of effort (are strenuous) for as long as told by your health care provider.  · Do exercises as told by your health care provider.  General instructions    · Take over-the-counter and prescription medicines only as told by your health care provider.  · If you have  questions or concerns about safety while taking pain medicine, talk with your health care provider.  · Do not drive or operate heavy machinery until you know how your pain medicine affects you.  · Do not use any tobacco products, such as cigarettes, chewing tobacco, and e-cigarettes. Tobacco can delay bone healing. If you need help quitting, ask your health care provider.  · Keep all follow-up visits as told by your health care provider. This is important.  How is this prevented?  · Warm up and stretch before being active.  · Cool down and stretch after being active.  · Give your body time to rest between periods of activity.  · Avoid:  ? Being physically inactive for long periods at a time.  ? Exercising or playing sports when you are tired or in pain.  · Use correct form when playing sports and lifting heavy objects.  · Use good posture when sitting and standing.  · Maintain a healthy weight.  · Sleep on a mattress with medium firmness to support your back.  · Make sure to use equipment that fits you, including shoes that fit well.  · Be safe and responsible while being active to avoid falls.  · Do at least 150 minutes of moderate-intensity exercise each week, such as brisk walking or water aerobics. Try a form of exercise that takes stress off your back, such as swimming or stationary cycling.  · Maintain physical fitness, including:  ? Strength. In particular, develop and maintain strong abdominal muscles.  ? Flexibility.  ? Cardiovascular fitness.  ? Endurance.  Contact a health care provider if:  · Your back pain does not improve after 6 weeks of treatment.  · Your symptoms get worse.  Get help right away if:  · Your back pain is severe.  · You are unable to stand or walk.  · You develop pain in your legs.  · You develop weakness in your buttocks or legs.  · You have difficulty controlling when you urinate or when you have a bowel movement.  This information is not intended to replace advice given to you by  your health care provider. Make sure you discuss any questions you have with your health care provider.  Document Released: 06/07/2005 Document Revised: 02/12/2016 Document Reviewed: 03/19/2015  [employer] Interactive Patient Education © 2018 [employer]

## 2017-10-19 NOTE — Progress Notes (Signed)
Patient ID: Jeffrey Briggs, male   DOB: 01-07-1976, 41 y.o.   MRN: [id number]    Jeffrey Briggs, is a 42 y.o. male  WUJ:[id number]  NFA:[id number]  DOB - Oct 17, 1975  Subjective:  Chief Complaint and HPI: Jeffrey Briggs is a 42 y.o. male here today to for a follow up visit 10/12/2017 emergency department today for fall.  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  He reports that he landed on his bilateral feet and then caught himself with his hands.  He denies any head trauma or loss of consciousness.  He reports that initially after the event he was walking without difficulty.  He reports that since that time he has developed bilateral low back soreness as well as right shoulder pain that is worse with movement.  Not taken anything for his symptoms.  He denies any open wounds, bowel/bladder incontinence, saddle anesthesia, urinary retention, numbness/tingling/weakness of the upper or lower extremities.  He is continuing to have a lot of pain but didn't get the prescriptions filled that he was given at the hospital. He has pain in his LB and L leg and into his L foot.  Says his L foot feels numb and weak.  No problems moving bowels or bladder.  He is also still having R shoulder pain and tightness.  Social:  His mom is in the hospital; multiple social stressors.   Xray R shoulder was negative.  Xray L-S spine unremarkable other than BB near L2-l3 FROM PREVIOUS INJURY.  ED/Hospital notes reviewed.   MRI 06/2017 L-S spine 1. No fracture or malalignment. 2. 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  ROS:   Constitutional:  No f/c, No night sweats, No unexplained weight loss. EENT:  No vision changes, No blurry vision, No hearing changes. No mouth, throat, or ear problems.  Respiratory: No cough, No SOB Cardiac: No CP, no  palpitations GI:  No abd pain, No N/V/D. GU: No Urinary s/sx Musculoskeletal: + LBP, R shoulder pain  Neuro: No headache, no dizziness, no motor weakness.  Skin: No rash Endocrine:  No polydipsia. No polyuria.  Psych: Denies SI/HI  No problems updated.  ALLERGIES: Allergies  Allergen Reactions  . Pollen Extract Other (See Comments)    Stuffy nose, red eyes    PAST MEDICAL HISTORY: Past Medical History:  Diagnosis Date  . Asthma     MEDICATIONS AT HOME: Prior to Admission medications   Medication Sig Start Date End Date Taking? Authorizing Provider  albuterol (PROVENTIL HFA;VENTOLIN HFA) 108 (90 Base) MCG/ACT inhaler Inhale 2 puffs into the lungs every 4 (four) hours as needed for wheezing or shortness of breath. Patient not taking: Reported on 10/19/2017 07/06/17   Storm Frisk, MD  albuterol (PROVENTIL) (2.5 MG/3ML) 0.083% nebulizer solution Take 3 mLs (2.5 mg total) by nebulization every 6 (six) hours as needed for wheezing or shortness of breath. Patient not taking: Reported on 10/19/2017 07/06/17   Storm Frisk, MD  fluticasone [facility]) 50 MCG/ACT nasal spray Place 2 sprays into both nostrils daily. Patient not taking: Reported on 07/07/2017 07/06/17   Storm Frisk, MD  methocarbamol (ROBAXIN) 500 MG tablet Take 2 tablets (1,000 mg total) by mouth 3 (three) times daily. X 1 week then prn muscle spasm 10/19/17   Anders Simmonds, PA-C  naproxen (NAPROSYN) 500  MG tablet Take 1 tablet (500 mg total) by mouth 2 (two) times daily with a meal. X 1 week then prn pain 10/19/17   Georgian Co M, PA-C  predniSONE (DELTASONE) 10 MG tablet Take 4 for three days 3 for three days 2 for three days 1 for three days and stop 07/06/17   Storm Frisk, MD     Objective:  EXAM:   Vitals:   10/19/17 0854  BP: 136/84  Pulse: 70  Resp: 16  Temp: (!) 97.4 F (36.3 C)  TempSrc: Oral  SpO2: 97%  Weight: 242 lb 6.4 oz (110 kg)    General appearance : A&OX3. NAD.  Non-toxic-appearing HEENT: Atraumatic and Normocephalic.  PERRLA. EOM intact.  Neck: supple, no JVD. No cervical lymphadenopathy. No thyromegaly Chest/Lungs:  Breathing-non-labored, Good air entry bilaterally, breath sounds normal without rales, rhonchi, or wheezing  CVS: S1 S2 regular, no murmurs, gallops, rubs  Back:  Pain in lumbar paraspinus region, no lumbar spine TTP, no c-spine TTP; + trapezius on R.  R shoulder with full S&ROM.  DTR U/L extremity B intact.  Normal foot exam.   Extremities: Bilateral Lower Ext shows no edema, both legs are warm to touch with = pulse throughout Neurology:  CN II-XII grossly intact, Non focal.   Psych:  TP linear. J/I WNL. Normal speech. Appropriate eye contact and affect.  Skin:  No Rash  Data Review No results found for: HGBA1C   Assessment & Plan   1. Acute bilateral low back pain with bilateral sciatica - methocarbamol (ROBAXIN) 500 MG tablet; Take 2 tablets (1,000 mg total) by mouth 3 (three) times daily. X 1 week then prn muscle spasm  Dispense: 90 tablet; Refill: 0 - naproxen (NAPROSYN) 500 MG tablet; Take 1 tablet (500 mg total) by mouth 2 (two) times daily with a meal. X 1 week then prn pain  Dispense: 60 tablet; Refill: 0  2. Hospital follow up exam  3. Shoulder pain R Believe just strain and muscle spasm related.  Needs to take meds-difficult to treat pain if he doesn't take his meds.      Patient have been counseled extensively about nutrition and exercise  Return in about 1 week (around 10/26/2017) for either with meor assign PCP; recheck lower back .  The patient was given clear instructions to go to ER or return to medical center if symptoms don't improve, worsen or new problems develop. The patient verbalized understanding. The patient was told to call to get lab results if they haven't heard anything in the next week.     Georgian Co, PA-C [facility] [place], [place] [phone]     10/19/2017, 9:03 AM

## 2017-10-27 ENCOUNTER — Inpatient Hospital Stay: Payer: Self-pay

## 2017-10-27 ENCOUNTER — Encounter: Payer: Self-pay | Admitting: Physician Assistant

## 2017-10-31 ENCOUNTER — Encounter: Payer: Self-pay | Admitting: Physician Assistant

## 2017-11-03 ENCOUNTER — Ambulatory Visit: Payer: Worker's Compensation | Attending: Family Medicine | Admitting: Physician Assistant

## 2017-11-03 VITALS — BP 142/89 | HR 74 | Temp 98.6°F | Resp 16 | Ht 69.0 in | Wt 242.2 lb

## 2017-11-03 DIAGNOSIS — J4541 Moderate persistent asthma with (acute) exacerbation: Secondary | ICD-10-CM | POA: Insufficient documentation

## 2017-11-03 DIAGNOSIS — Z79899 Other long term (current) drug therapy: Secondary | ICD-10-CM | POA: Insufficient documentation

## 2017-11-03 DIAGNOSIS — Z7951 Long term (current) use of inhaled steroids: Secondary | ICD-10-CM | POA: Insufficient documentation

## 2017-11-03 MED ORDER — ALBUTEROL SULFATE (2.5 MG/3ML) 0.083% IN NEBU: 2.5000 mg | INHALATION_SOLUTION | Freq: Four times a day (QID) | RESPIRATORY_TRACT | 12 refills | Status: AC | PRN

## 2017-11-03 MED ORDER — ALBUTEROL SULFATE HFA 108 (90 BASE) MCG/ACT IN AERS: 2.0000 | INHALATION_SPRAY | RESPIRATORY_TRACT | 3 refills | Status: AC | PRN

## 2017-11-03 MED ORDER — CETIRIZINE HCL 10 MG PO TABS: 10.0000 mg | ORAL_TABLET | Freq: Every day | ORAL | 11 refills | Status: AC

## 2017-11-03 MED ORDER — MONTELUKAST SODIUM 10 MG PO TABS: 10.0000 mg | ORAL_TABLET | Freq: Every day | ORAL | 3 refills | Status: AC

## 2017-11-03 MED FILL — ALBUTEROL SULFATE HFA 108 (: 108 (90 BAS | 16 days supply | Qty: 18 | Fill #0

## 2017-11-03 MED FILL — MONTELUKAST SOD 10 MG TAB: 10 | 30 days supply | Qty: 30 | Fill #0

## 2017-11-03 MED FILL — ALBUTEROL SUL 2.5 MG/3 ML S: (2.5 MG/3ML | 6 days supply | Qty: 75 | Fill #0

## 2017-11-03 NOTE — Patient Instructions (Signed)

## 2017-11-03 NOTE — Progress Notes (Signed)
Pt. Is here for his back, sharp pain shooting down on his left side.  Patient stated he have pain on his neck and numbness on his hand.

## 2017-11-03 NOTE — Progress Notes (Signed)
Patient ID: Jeffrey Briggs, male   DOB: 11-Feb-1976, 42 y.o.   MRN: [id number]   Jeffrey Briggs, is a 42 y.o. male  [id number]  [id number]:[id number]  DOB - [birth year]  Subjective:  Chief Complaint and HPI: Jeffrey Briggs is a 42 y.o. male here today for wheezing for about 2 weeks.  Still having pain s/p fall.  Saw ortho 2 days ago and new meds for pain have been sent to the pharmacy.  They put him on light duty/sit down work only.  He has a h/o asthma.   He has never been on Advair or maintenance meds.  Seems to suffer most with wheezing from allergies.  Not on allergy meds.    ROS:   Constitutional:  No f/c, No night sweats, No unexplained weight loss. EENT:  No vision changes, No blurry vision, No hearing changes. No mouth, throat, or ear problems.  Respiratory: some cough, mild wheezing Cardiac: No CP, no palpitations GI:  No abd pain, No N/V/D. GU: No Urinary s/sx Musculoskeletal: +LBP, L leg/foot pain Neuro: No headache, no dizziness, no motor weakness.  Skin: No rash Endocrine:  No polydipsia. No polyuria.  Psych: Denies SI/HI  No problems updated.  ALLERGIES: Allergies  Allergen Reactions  . Pollen Extract Other (See Comments)    Stuffy nose, red eyes    PAST MEDICAL HISTORY: Past Medical History:  Diagnosis Date  . Asthma     MEDICATIONS AT HOME: Prior to Admission medications   Medication Sig Start Date End Date Taking? Authorizing Provider  methocarbamol (ROBAXIN) 500 MG tablet Take 2 tablets (1,000 mg total) by mouth 3 (three) times daily. X 1 week then prn muscle spasm 10/19/17  Yes Margie Urbanowicz M, PA-C  predniSONE (DELTASONE) 10 MG tablet Take 4 for three days 3 for three days 2 for three days 1 for three days and stop 07/06/17  Yes Storm Frisk, MD  albuterol (PROVENTIL HFA;VENTOLIN HFA) 108 (90 Base) MCG/ACT inhaler Inhale 2 puffs into the lungs every 4 (four) hours as needed for wheezing or shortness of breath. 11/03/17   Anders Simmonds, PA-C  albuterol  (PROVENTIL) (2.5 MG/3ML) 0.083% nebulizer solution Take 3 mLs (2.5 mg total) by nebulization every 6 (six) hours as needed for wheezing or shortness of breath. 11/03/17   Anders Simmonds, PA-C  cetirizine (ZYRTEC) 10 MG tablet Take 1 tablet (10 mg total) by mouth daily. 11/03/17   Anders Simmonds, PA-C  fluticasone (FLONASE) 50 MCG/ACT nasal spray Place 2 sprays into both nostrils daily. Patient not taking: Reported on 07/07/2017 07/06/17   Storm Frisk, MD  montelukast (SINGULAIR) 10 MG tablet Take 1 tablet (10 mg total) by mouth at bedtime. 11/03/17   Anders Simmonds, PA-C  naproxen (NAPROSYN) 500 MG tablet Take 1 tablet (500 mg total) by mouth 2 (two) times daily with a meal. X 1 week then prn pain Patient not taking: Reported on 11/03/2017 10/19/17   Anders Simmonds, PA-C     Objective:  EXAM:   Vitals:   11/03/17 1429  BP: (!) 142/89  Pulse: 74  Resp: 16  Temp: 98.6 F (37 C)  TempSrc: Oral  SpO2: 98%  Weight: 242 lb 3.2 oz (109.9 kg)  Height:  (1.753 m)    General appearance : A&OX3. NAD. Non-toxic-appearing HEENT: Atraumatic and Normocephalic.  PERRLA. EOM intact.  Neck: supple, no JVD. No cervical lymphadenopathy. No thyromegaly Chest/Lungs:  Breathing-non-labored, Good air entry bilaterally, there is mild to moderate wheezing  throughout.  No rales/no rhonchi CVS: S1 S2 regular, no murmurs, gallops, rubs  Extremities: Bilateral Lower Ext shows no edema, both legs are warm to touch with = pulse throughout Neurology:  CN II-XII grossly intact, Non focal.   Psych:  TP linear. J/I WNL. Normal speech. Appropriate eye contact and affect.  Skin:  No Rash  Data Review No results found for: HGBA1C   Assessment & Plan   1. Moderate persistent asthma with acute exacerbation Not controlled.  Restart inhalers and start zyrtec and singulair - albuterol (PROVENTIL HFA;VENTOLIN HFA) 108 (90 Base) MCG/ACT inhaler; Inhale 2 puffs into the lungs every 4 (four) hours as  needed for wheezing or shortness of breath.  Dispense: 1 Inhaler; Refill: 3 - albuterol (PROVENTIL) (2.5 MG/3ML) 0.083% nebulizer solution; Take 3 mLs (2.5 mg total) by nebulization every 6 (six) hours as needed for wheezing or shortness of breath.  Dispense: 75 mL; Refill: 12 - montelukast (SINGULAIR) 10 MG tablet; Take 1 tablet (10 mg total) by mouth at bedtime.  Dispense: 30 tablet; Refill: 3 - cetirizine (ZYRTEC) 10 MG tablet; Take 1 tablet (10 mg total) by mouth daily.  Dispense: 30 tablet; Refill: 11  F/up with ortho for work injury.     Patient have been counseled extensively about nutrition and exercise  Return in about 1 month (around 12/01/2017) for assign PCP; f/up asthma.  The patient was given clear instructions to go to ER or return to medical center if symptoms don't improve, worsen or new problems develop. The patient verbalized understanding. The patient was told to call to get lab results if they haven't heard anything in the next week.     Georgian Co, PA-C [facility] [place], [place] [phone]   11/03/2017, 2:59 PM

## 2018-05-21 IMAGING — DX DG FOOT COMPLETE 3+V*L*
3 series · 3 of 3 positions shown · non-contrast
Comparison: None.

CLINICAL DATA: Left foot pain.

EXAM:
LEFT FOOT - COMPLETE 3+ VIEW

[foot ap]
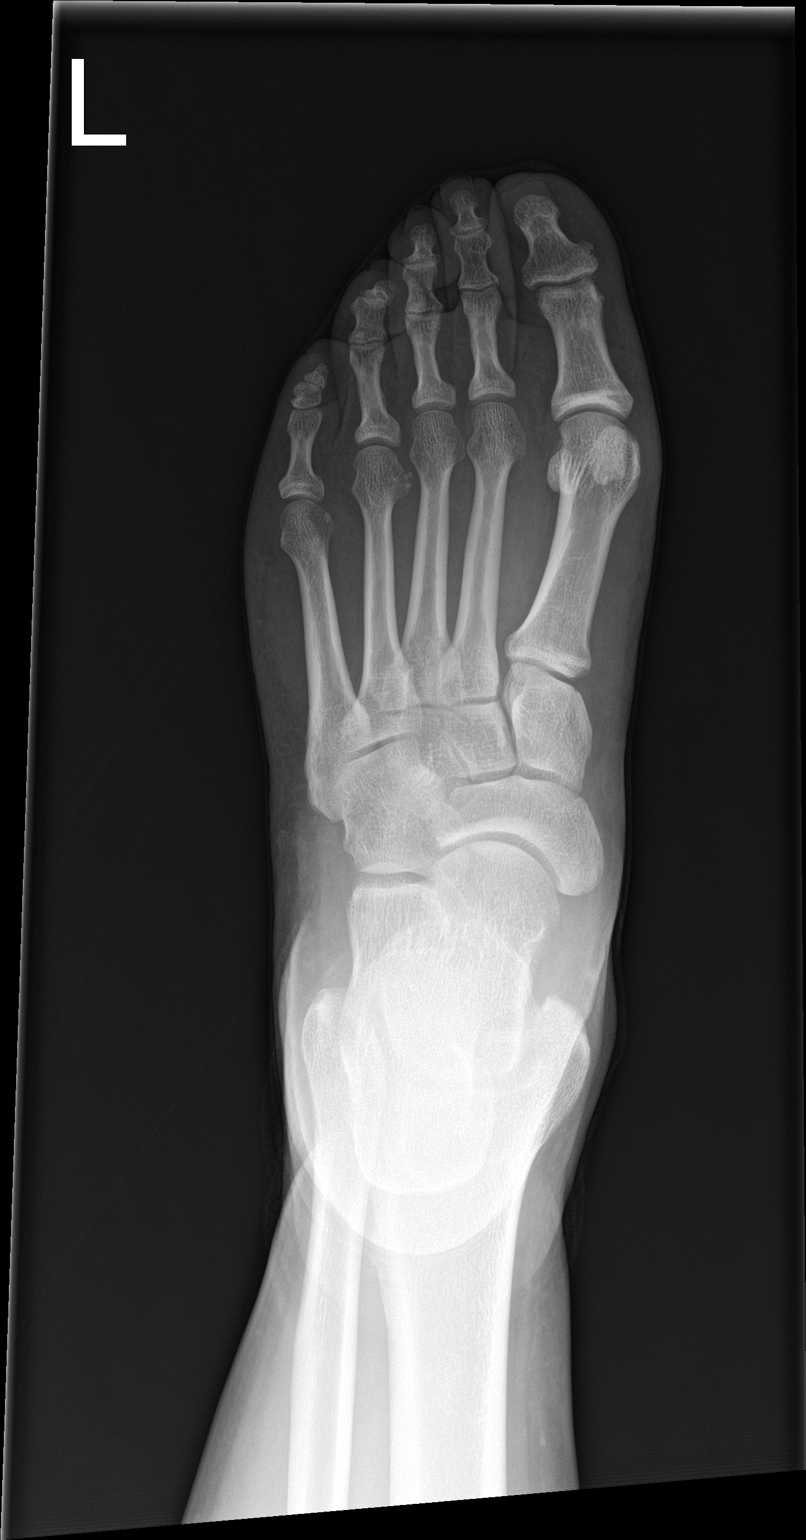

[foot obl]
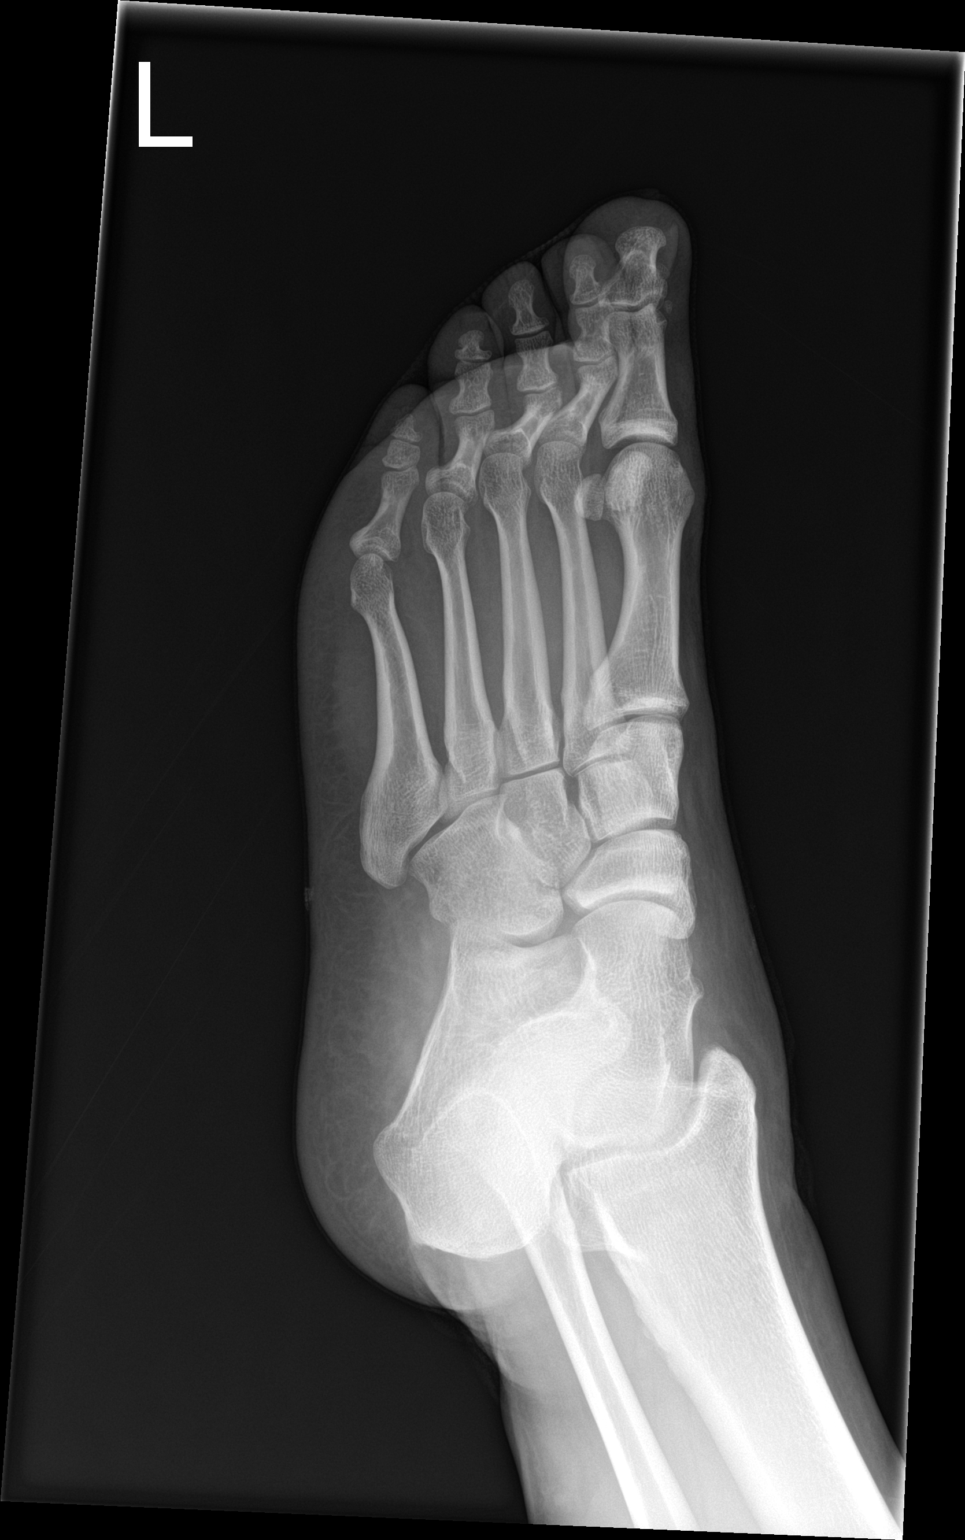

[foot lat]
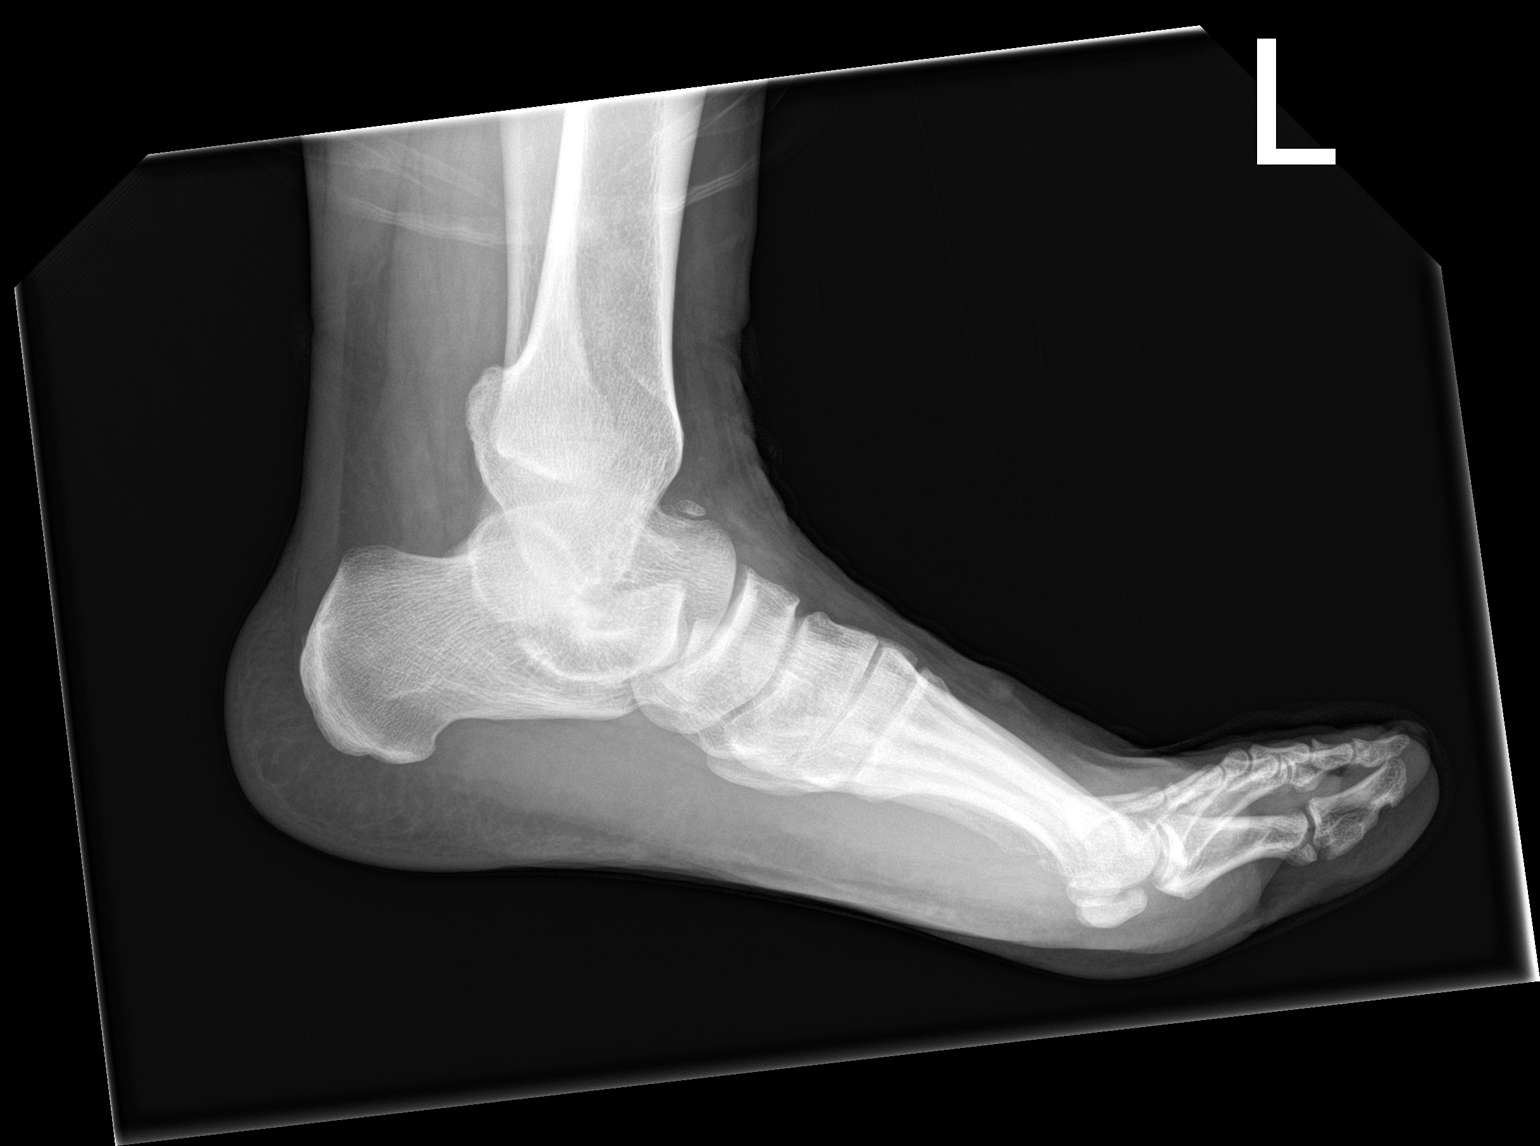

[3 of 3 positions shown; findings below may reference images not displayed]

FINDINGS: There is no evidence of fracture or dislocation. There is no
evidence of arthropathy or other significant focal bone abnormality.
Possible old nonunion avulsion from the distal aspect of the
proximal phalanx of the great toe. Small ossification at the dorsal
aspect of the distal talus is not felt to be significant. Soft
tissues are unremarkable.
IMPRESSION: No significant abnormalities.

## 2018-09-03 IMAGING — CR DG SHOULDER 2+V*R*
3 series · 3 of 3 positions shown · non-contrast
Comparison: None.

CLINICAL DATA: Fell.  Pain.

EXAM:
RIGHT SHOULDER - 2+ VIEW

[w shoulder external right]
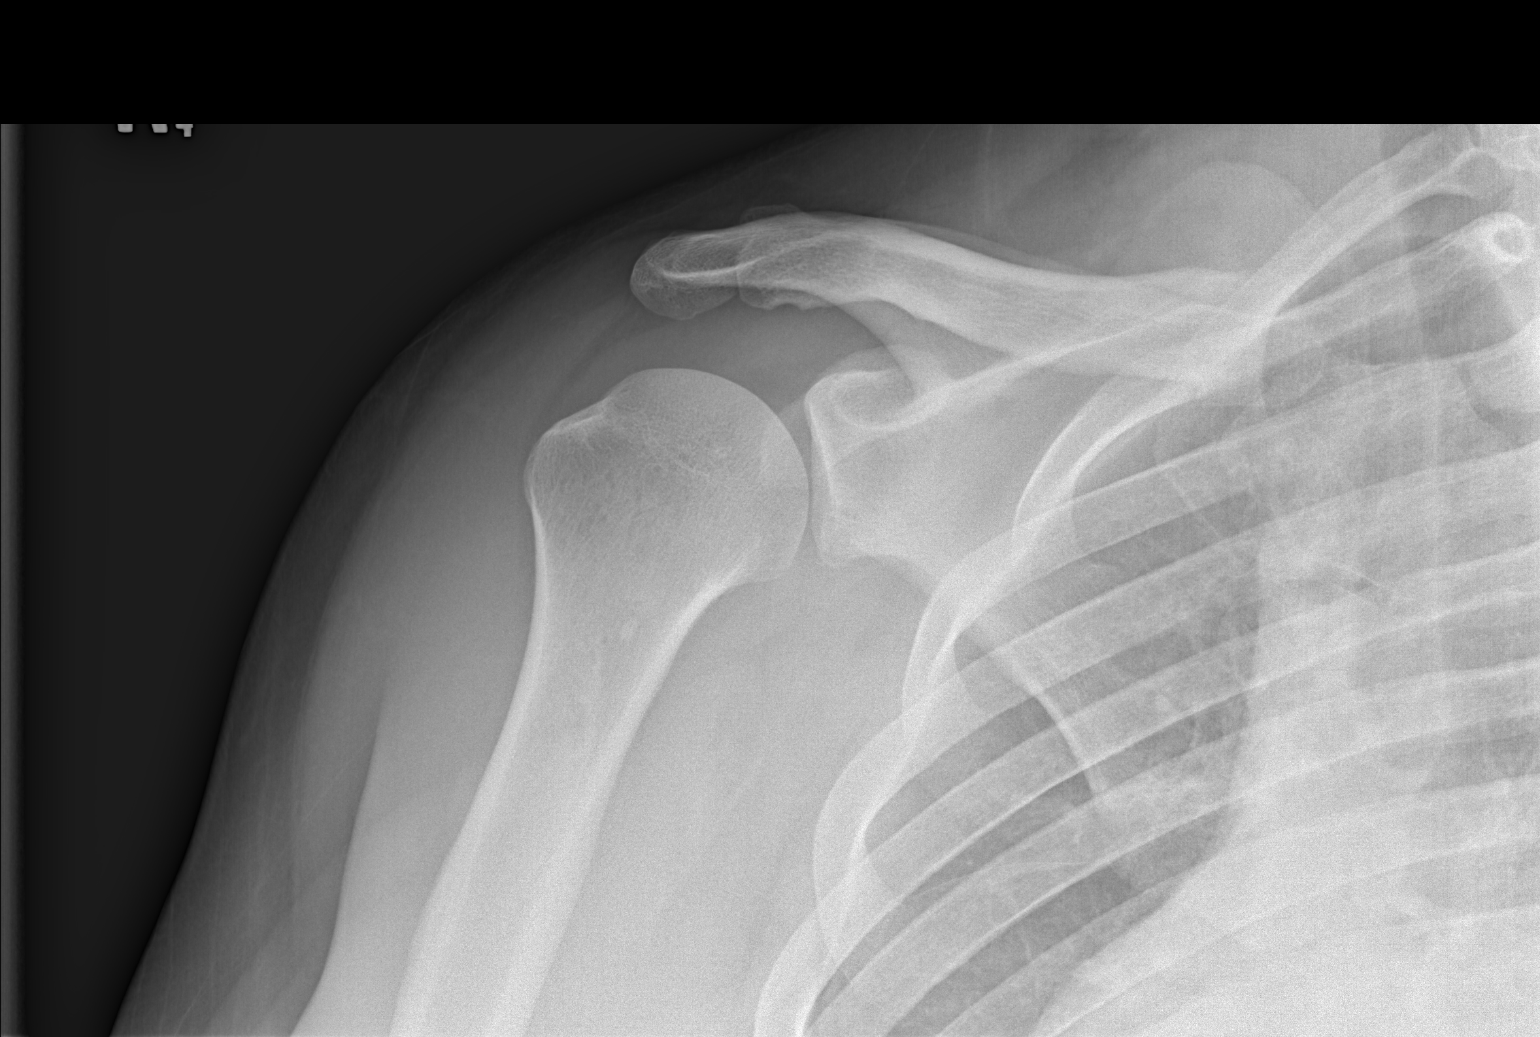

[w shoulder y-view right]
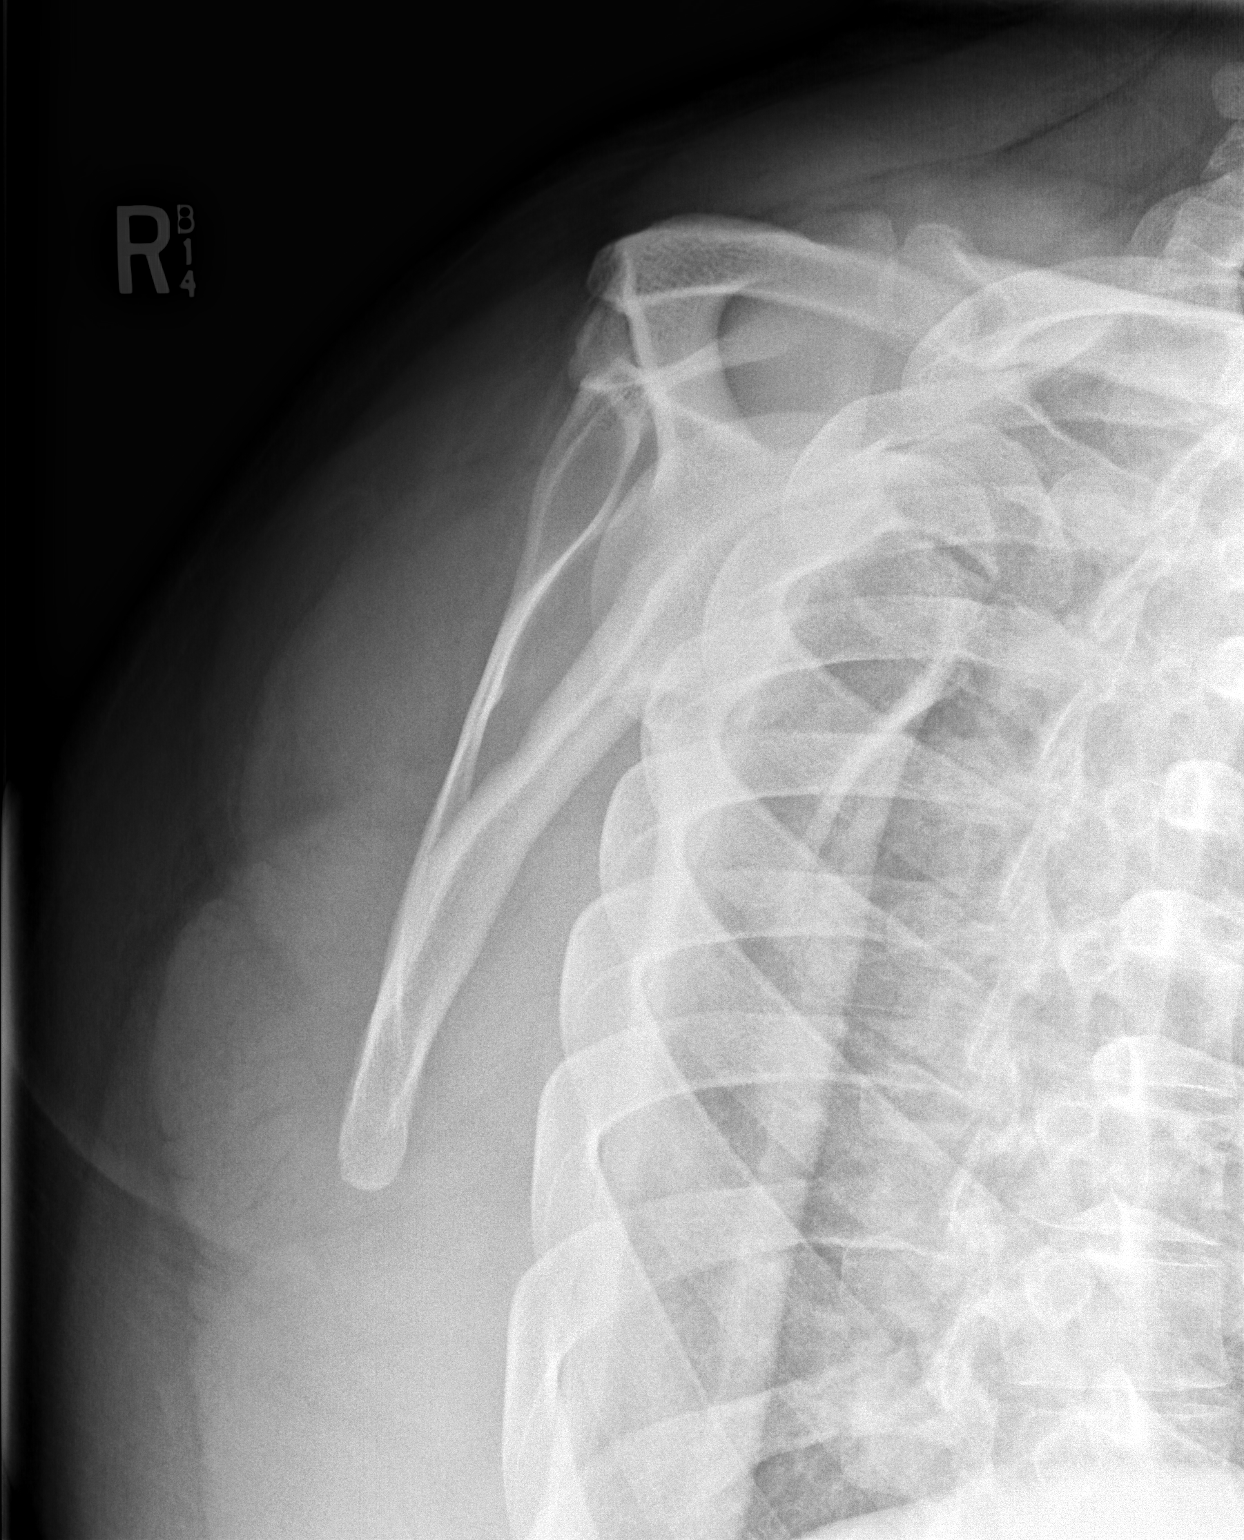

[x shoulder axillary right]
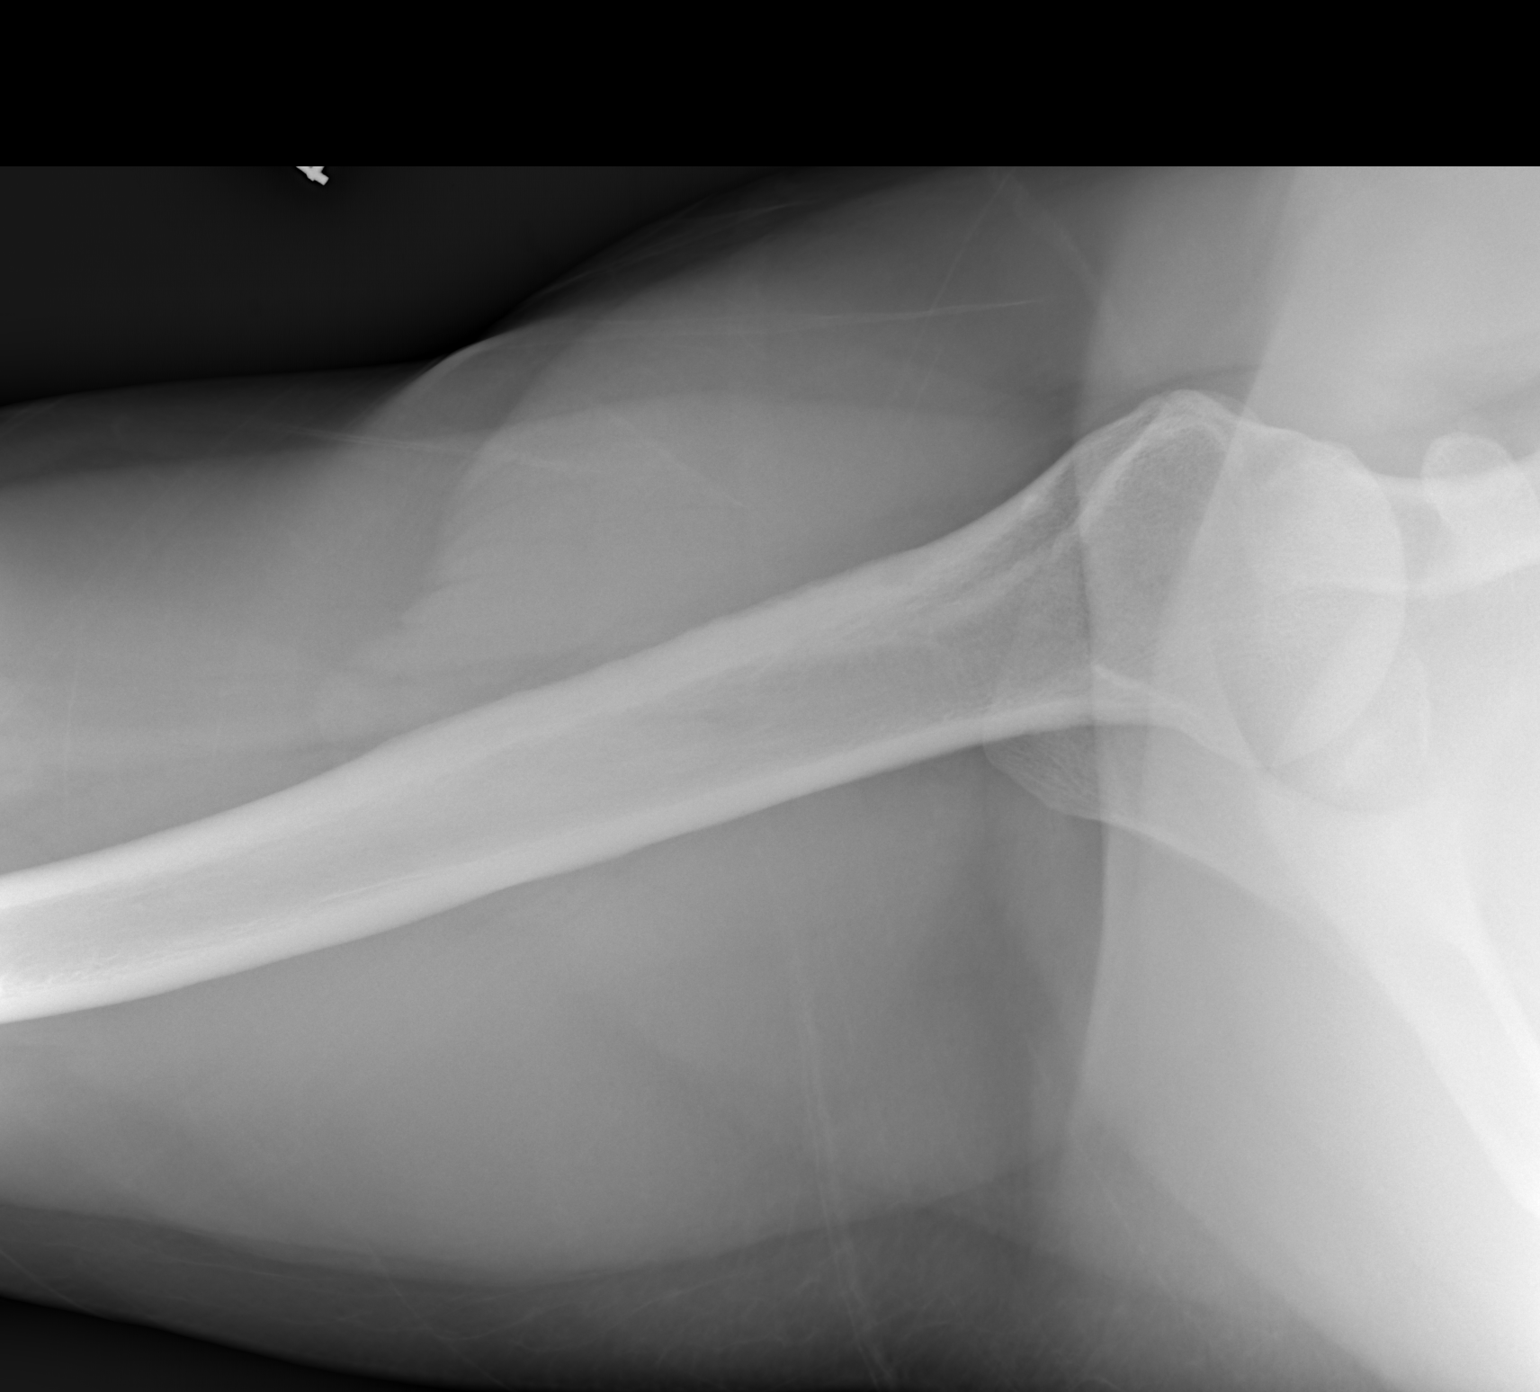

[3 of 3 positions shown; findings below may reference images not displayed]

FINDINGS: There is no evidence of fracture or dislocation. There is no
evidence of arthropathy or other focal bone abnormality. Soft
tissues are unremarkable.
IMPRESSION: Negative.
